# Patient Record
Sex: Female | Born: 1990
Health system: Southern US, Community
[De-identification: ages and names within clinical notes are randomized; demographics above are authoritative.]

## PROBLEM LIST (undated history)

## (undated) DIAGNOSIS — M51369 Other intervertebral disc degeneration, lumbar region without mention of lumbar back pain or lower extremity pain: Secondary | ICD-10-CM

## (undated) DIAGNOSIS — M5136 Other intervertebral disc degeneration, lumbar region: Secondary | ICD-10-CM

## (undated) DIAGNOSIS — N390 Urinary tract infection, site not specified: Secondary | ICD-10-CM

## (undated) DIAGNOSIS — K219 Gastro-esophageal reflux disease without esophagitis: Secondary | ICD-10-CM

## (undated) DIAGNOSIS — G43909 Migraine, unspecified, not intractable, without status migrainosus: Secondary | ICD-10-CM

## (undated) HISTORY — DX: Urinary tract infection, site not specified: N39.0

## (undated) HISTORY — DX: Migraine, unspecified, not intractable, without status migrainosus: G43.909

## (undated) HISTORY — PX: TYMPANOSTOMY TUBE PLACEMENT: SHX32

## (undated) HISTORY — DX: Gastro-esophageal reflux disease without esophagitis: K21.9

## (undated) HISTORY — PX: NO PAST SURGERIES: SHX2092

---
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
## 2012-01-03 ENCOUNTER — Ambulatory Visit (INDEPENDENT_AMBULATORY_CARE_PROVIDER_SITE_OTHER): Payer: BC Managed Care – PPO | Admitting: Family Medicine

## 2012-01-03 ENCOUNTER — Encounter: Payer: Self-pay | Admitting: Family Medicine

## 2012-01-03 ENCOUNTER — Ambulatory Visit: Payer: BC Managed Care – PPO | Admitting: Family Medicine

## 2012-01-03 VITALS — BP 104/62 | HR 110 | Temp 98.2°F | Ht 65.25 in | Wt 120.0 lb

## 2012-01-03 DIAGNOSIS — Z0000 Encounter for general adult medical examination without abnormal findings: Secondary | ICD-10-CM

## 2012-01-03 DIAGNOSIS — Z23 Encounter for immunization: Secondary | ICD-10-CM

## 2012-01-03 NOTE — Progress Notes (Signed)
  Subjective:    Patient ID: Samantha Knapp, female    DOB: [birth year], 21 y.o.   MRN: [id number]  HPI 21 yr old female to establish with [place] and for a cpx. She had seen Dr. Carlean Purl when younger. She feels fine and has no concerns. 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 She tries to eat well and gets exercise. She plays on a softball team. She started menses at the age of 68, and she has been regular with no problems since then. She has never been sexually active.    Review of Systems  Constitutional: Negative.   HENT: Negative.   Eyes: Negative.   Respiratory: Negative.   Cardiovascular: Negative.   Gastrointestinal: Negative.   Genitourinary: Negative for dysuria, urgency, frequency, hematuria, flank pain, decreased urine volume, enuresis, difficulty urinating, pelvic pain and dyspareunia.  Musculoskeletal: Negative.   Skin: Negative.   Neurological: Negative.   Hematological: Negative.   Psychiatric/Behavioral: Negative.        Objective:   Physical Exam  Constitutional: She is oriented to person, place, and time. She appears well-developed and well-nourished. No distress.  HENT:  Head: Normocephalic and atraumatic.  Right Ear: External ear normal.  Left Ear: External ear normal.  Nose: Nose normal.  Mouth/Throat: Oropharynx is clear and moist. No oropharyngeal exudate.  Eyes: Conjunctivae and EOM are normal. Pupils are equal, round, and reactive to light. No scleral icterus.  Neck: Normal range of motion. Neck supple. No JVD present. No thyromegaly present.  Cardiovascular: Normal rate, regular rhythm, normal heart sounds and intact distal pulses.  Exam reveals no gallop and no friction rub.   No murmur heard. Pulmonary/Chest: Effort normal and breath sounds normal. No respiratory distress. She has no wheezes. She has no rales. She exhibits no tenderness.  Abdominal: Soft. Bowel sounds are normal. She exhibits no  distension and no mass. There is no tenderness. There is no rebound and no guarding.  Musculoskeletal: Normal range of motion. She exhibits no edema and no tenderness.  Lymphadenopathy:    She has no cervical adenopathy.  Neurological: She is alert and oriented to person, place, and time. She has normal reflexes. No cranial nerve deficit. She exhibits normal muscle tone. Coordination normal.  Skin: Skin is warm and dry. No rash noted. No erythema.  Psychiatric: She has a normal mood and affect. Her behavior is normal. Judgment and thought content normal.          Assessment & Plan:  Well exam. Given a flu shot and her first Gardisil shot today. Get old records.

## 2012-01-03 NOTE — Progress Notes (Signed)
Addended by: Aniceto Boss A on: 01/03/2012 11:20 AM   Modules accepted: Orders

## 2012-03-02 ENCOUNTER — Ambulatory Visit: Payer: BC Managed Care – PPO | Admitting: Family Medicine

## 2012-03-05 ENCOUNTER — Ambulatory Visit (INDEPENDENT_AMBULATORY_CARE_PROVIDER_SITE_OTHER): Payer: BC Managed Care – PPO | Admitting: Family Medicine

## 2012-03-05 DIAGNOSIS — Z23 Encounter for immunization: Secondary | ICD-10-CM

## 2012-03-14 ENCOUNTER — Encounter: Payer: Self-pay | Admitting: Family Medicine

## 2012-03-14 ENCOUNTER — Ambulatory Visit (INDEPENDENT_AMBULATORY_CARE_PROVIDER_SITE_OTHER): Payer: BC Managed Care – PPO | Admitting: Family Medicine

## 2012-03-14 VITALS — BP 98/60 | HR 79 | Temp 98.5°F | Wt 124.0 lb

## 2012-03-14 DIAGNOSIS — L708 Other acne: Secondary | ICD-10-CM

## 2012-03-14 DIAGNOSIS — L709 Acne, unspecified: Secondary | ICD-10-CM

## 2012-03-14 DIAGNOSIS — R5383 Other fatigue: Secondary | ICD-10-CM

## 2012-03-14 DIAGNOSIS — R0602 Shortness of breath: Secondary | ICD-10-CM

## 2012-03-14 LAB — CBC WITH DIFFERENTIAL/PLATELET
Basophils Absolute: 0 10*3/uL (ref 0.0–0.1)
Eosinophils Absolute: 0.2 10*3/uL (ref 0.0–0.7)
Lymphocytes Relative: 33.2 % (ref 12.0–46.0)
Lymphs Abs: 2.1 10*3/uL (ref 0.7–4.0)
Monocytes Relative: 9.8 % (ref 3.0–12.0)
Platelets: 247 10*3/uL (ref 150.0–400.0)
RDW: 13.9 % (ref 11.5–14.6)

## 2012-03-14 LAB — BASIC METABOLIC PANEL
BUN: 8 mg/dL (ref 6–23)
Creatinine, Ser: 0.7 mg/dL (ref 0.4–1.2)
GFR: 109.41 mL/min (ref >60.00)

## 2012-03-14 LAB — TSH: TSH: 1.42 u[IU]/mL (ref 0.35–5.50)

## 2012-03-14 NOTE — Progress Notes (Signed)
  Subjective:    Patient ID: Samantha Knapp, female    DOB: 1991-02-20, 21 y.o.   MRN: [id number]  HPI Here asking about possible exercise induced asthma. She has always been active and involved with sports, and has never had much trouble with exercise until the last month or two. She is currently playing softball in a church league, and she often feels very SOB when she runs hard. No chest pains or palpitations, she just has a hard time catching her breath for a few minutes after a hard run. She recovers quickly, and she has never had to come out of a game for this. She denies any wheezing or coughing. She has never had asthma before, but she has been given an inhaler as part of the treatment for a bronchitis she had last year. This is a Advertising account planner inhaler, which she only used once or twice. No family hx of asthma. Also she asks for a referral for her acne, which invloves her face and upper back.    Review of Systems  Constitutional: Negative.   Respiratory: Positive for shortness of breath. Negative for apnea, cough, choking, chest tightness, wheezing and stridor.   Cardiovascular: Negative.        Objective:   Physical Exam  Constitutional: She appears well-developed and well-nourished.  Neck: Neck supple. No thyromegaly present.  Cardiovascular: Normal rate, regular rhythm, normal heart sounds and intact distal pulses.  Exam reveals no gallop and no friction rub.   No murmur heard. Pulmonary/Chest: Effort normal and breath sounds normal. No respiratory distress. She has no wheezes. She has no rales. She exhibits no tenderness.  Lymphadenopathy:    She has no cervical adenopathy.          Assessment & Plan:  This does not sound like true exercise induced asthma, but since she has a Proair inhaler at home, I asked her to try this. She will take 2 puffs just before any games or practices to see if this helps. We will defer a detailed workup for now. Certainly we can get PFTs etc if  needed. I suspect something else may be involved, such as anemia, since she tends to have heavy menses. We will check labs today including a TSH and a CBC. Refer to Dermatology for the acne.

## 2012-03-20 NOTE — Progress Notes (Signed)
Quick Note:  Pt informed on VM ______ 

## 2012-07-02 ENCOUNTER — Ambulatory Visit (INDEPENDENT_AMBULATORY_CARE_PROVIDER_SITE_OTHER): Payer: BC Managed Care – PPO | Admitting: Family Medicine

## 2012-07-02 DIAGNOSIS — Z23 Encounter for immunization: Secondary | ICD-10-CM

## 2012-07-02 DIAGNOSIS — Z0000 Encounter for general adult medical examination without abnormal findings: Secondary | ICD-10-CM

## 2012-08-14 ENCOUNTER — Encounter: Payer: Self-pay | Admitting: Internal Medicine

## 2012-08-14 ENCOUNTER — Ambulatory Visit (INDEPENDENT_AMBULATORY_CARE_PROVIDER_SITE_OTHER): Payer: BC Managed Care – PPO | Admitting: Internal Medicine

## 2012-08-14 VITALS — BP 122/62 | Temp 98.8°F | Wt 129.0 lb

## 2012-08-14 DIAGNOSIS — M545 Low back pain, unspecified: Secondary | ICD-10-CM

## 2012-08-14 DIAGNOSIS — G8929 Other chronic pain: Secondary | ICD-10-CM

## 2012-08-14 NOTE — Progress Notes (Signed)
  Subjective:    Patient ID: Samantha Knapp, female    DOB: [birth year], 21 y.o.   MRN: [id number]  HPI  21 year old Female complains of chronic intermittent low back pain. Patient reports her symptoms have been on and off for last one year. She describes tooth ache like pain in her right lower back. Severity is rated as 6/10. She sometimes wakes up with pain and also weightbearing on her right leg seems to make the pain worse. Symptoms are improved with laying down. She denies any radiation into her right leg. She denies any fever or chills. She denies any urinary symptoms.   Review of Systems Negative for lower extremity weakness.  She denies morning stiffness. No history of the joint pains or unusual rashes.  Past Medical History  Diagnosis Date  . Migraines   . Frequent UTI     History   Social History  . Marital Status: Single    Spouse Name: N/A    Number of Children: N/A  . Years of Education: N/A   Occupational History  . Not on file.   Social History Main Topics  . Smoking status: Passive Smoke Exposure - Never Smoker  . Smokeless tobacco: Never Used  . Alcohol Use: No  . Drug Use: No  . Sexually Active: Yes    Birth Control/ Protection: Condom   Other Topics Concern  . Not on file   Social History Narrative  . No narrative on file    Past Surgical History  Procedure Date  . Tympanostomy tube placement     Family History  Problem Relation Age of Onset  . Hyperlipidemia Mother     No Known Allergies  Current Outpatient Prescriptions on File Prior to Visit  Medication Sig Dispense Refill  . desogestrel-ethinyl estradiol (APRI,EMOQUETTE,SOLIA) 0.15-30 MG-MCG tablet Take 1 tablet by mouth daily.        BP 122/62  Temp 98.8 F (37.1 C) (Oral)  Wt 129 lb (58.514 kg)       Objective:   Physical Exam  Constitutional: She is oriented to person, place, and time. She appears well-developed and well-nourished.  Cardiovascular: Normal rate, regular  rhythm and normal heart sounds.   Pulmonary/Chest: Effort normal and breath sounds normal. She has no wheezes.  Musculoskeletal:       Right sacroiliac tenderness No obvious scoliosis Muscle strength is 5 out of 5 in her lower extremities Positive straight leg raise test on right side  Neurological: She is alert and oriented to person, place, and time. She displays normal reflexes. No cranial nerve deficit. She exhibits normal muscle tone.  Skin: Skin is warm and dry. No rash noted.          Assessment & Plan:

## 2012-08-14 NOTE — Patient Instructions (Signed)
Please make a follow up appointment within 2 weeks of completing your MRI of lumbar spine.

## 2012-08-14 NOTE — Assessment & Plan Note (Signed)
21 year old female with chronic low back pain of unclear etiology. Considering chronicity of her symptoms (1 year) and positive straight leg test obtain MRI of lumbar spine without contrast.  She has sacroiliac tenderness. It would be interesting to see if she has sacroiliitis on MRI.  Rule out radiculopathy.  Use OTC ibuprofen as directed for now.

## 2012-08-20 ENCOUNTER — Ambulatory Visit
Admission: RE | Admit: 2012-08-20 | Discharge: 2012-08-20 | Disposition: A | Discharge status: Home / Self Care | Payer: BC Managed Care – PPO | Source: Ambulatory Visit | Attending: Internal Medicine | Admitting: Internal Medicine

## 2012-08-20 DIAGNOSIS — M545 Low back pain: Secondary | ICD-10-CM

## 2012-09-03 ENCOUNTER — Ambulatory Visit (INDEPENDENT_AMBULATORY_CARE_PROVIDER_SITE_OTHER): Payer: BC Managed Care – PPO | Admitting: Internal Medicine

## 2012-09-03 ENCOUNTER — Encounter: Payer: Self-pay | Admitting: Internal Medicine

## 2012-09-03 VITALS — BP 108/72 | Temp 98.9°F | Wt 126.0 lb

## 2012-09-03 DIAGNOSIS — G8929 Other chronic pain: Secondary | ICD-10-CM

## 2012-09-03 DIAGNOSIS — M545 Low back pain: Secondary | ICD-10-CM

## 2012-09-03 MED ORDER — METHOCARBAMOL 500 MG PO TABS: 250.0000 mg | ORAL_TABLET | Freq: Three times a day (TID) | ORAL | Status: DC | PRN

## 2012-09-03 NOTE — Progress Notes (Signed)
  Subjective:    Patient ID: Samantha Knapp, female    DOB: 05/25/1991, 21 y.o.   MRN: [id number]  HPI  21 year old Hispanic female for followup regarding low back pain. Patient continues to have intermittent right-sided back discomfort. She is taking over-the-counter ibuprofen as needed. MRI of lumbar spine was ordered due to chronicity of symptoms and positive right straight leg test.  Patient had tenderness at right SI Joint on exam. MRI of lumbar spine showed mild degenerative disease causing mild impingement at the L4-5 and L5-S1 levels. Patient also noted to have mild central stenosis. Her SI Joints were normal in appearance.   Review of Systems Negative for leg weakness   Past Medical History  Diagnosis Date  . Migraines   . Frequent UTI     History   Social History  . Marital Status: Single    Spouse Name: N/A    Number of Children: N/A  . Years of Education: N/A   Occupational History  . Not on file.   Social History Main Topics  . Smoking status: Passive Smoke Exposure - Never Smoker  . Smokeless tobacco: Never Used  . Alcohol Use: No  . Drug Use: No  . Sexually Active: Yes    Birth Control/ Protection: Condom   Other Topics Concern  . Not on file   Social History Narrative  . No narrative on file    Past Surgical History  Procedure Date  . Tympanostomy tube placement     Family History  Problem Relation Age of Onset  . Hyperlipidemia Mother     No Known Allergies  Current Outpatient Prescriptions on File Prior to Visit  Medication Sig Dispense Refill  . desogestrel-ethinyl estradiol (APRI,EMOQUETTE,SOLIA) 0.15-30 MG-MCG tablet Take 1 tablet by mouth daily.      Marland Kitchen ibuprofen (ADVIL) 200 MG tablet Take 2 tablets (400 mg total) by mouth every 12 (twelve) hours as needed for pain.  30 tablet  0    BP 108/72  Temp 98.9 F (37.2 C) (Oral)  Wt 126 lb (57.153 kg)       Objective:   Physical Exam  Constitutional: She is oriented to person,  place, and time. She appears well-developed and well-nourished.  Cardiovascular: Normal rate, regular rhythm and normal heart sounds.   Pulmonary/Chest: Effort normal and breath sounds normal. She has no wheezes.  Neurological: She is alert and oriented to person, place, and time. She has normal reflexes.       Lower extremity muscle strength is 5 out of 5          Assessment & Plan:

## 2012-09-03 NOTE — Assessment & Plan Note (Signed)
MRI of lumbar spine shows mild lumbar degenerative disc disease causing mild impingement at L4-5 and L5-S1 levels. She is not a surgical candidate. I suggest conservative therapy. Refer to PT. Use Robaxin 250 mg 3 times a day as needed.

## 2012-09-10 ENCOUNTER — Telehealth: Payer: Self-pay | Admitting: Family Medicine

## 2012-09-10 NOTE — Telephone Encounter (Signed)
Patient called stating that she has been referred to Pt on [address] and she can not afford to drive there and would like to know if she can be referred to [facility] in [place] as they are located 5 minutes from her home. Please advise/assist. Dr. Artist Pais did this referral. Patient is scheduled for tomorrow.

## 2012-09-10 NOTE — Telephone Encounter (Signed)
Samantha Knapp, will you change appt to [facility] at [facility]?  Thanks

## 2012-10-10 ENCOUNTER — Ambulatory Visit (INDEPENDENT_AMBULATORY_CARE_PROVIDER_SITE_OTHER): Payer: BC Managed Care – PPO | Admitting: Family Medicine

## 2012-10-10 ENCOUNTER — Encounter: Payer: Self-pay | Admitting: Family Medicine

## 2012-10-10 VITALS — BP 102/60 | HR 98 | Temp 98.7°F | Wt 125.0 lb

## 2012-10-10 DIAGNOSIS — J329 Chronic sinusitis, unspecified: Secondary | ICD-10-CM

## 2012-10-10 MED ORDER — AZITHROMYCIN 250 MG PO TABS: ORAL_TABLET | ORAL | Status: DC

## 2012-10-10 NOTE — Progress Notes (Signed)
  Subjective:    Patient ID: Samantha Knapp, female    DOB: 1991-06-08, 21 y.o.   MRN: [id number]  HPI Here for one week of sinus pressure, PND, ST, HA, and a dry cough. No fever.    Review of Systems  Constitutional: Negative.   HENT: Positive for congestion, postnasal drip and sinus pressure.   Eyes: Negative.   Respiratory: Positive for cough.        Objective:   Physical Exam  Constitutional: She appears well-developed and well-nourished.  HENT:  Right Ear: External ear normal.  Left Ear: External ear normal.  Nose: Nose normal.  Mouth/Throat: Oropharynx is clear and moist.  Eyes: Conjunctivae normal are normal.  Pulmonary/Chest: Effort normal and breath sounds normal.  Lymphadenopathy:    She has no cervical adenopathy.          Assessment & Plan:  Add Mucinex D

## 2013-06-25 ENCOUNTER — Encounter: Payer: Self-pay | Admitting: Family Medicine

## 2013-06-25 ENCOUNTER — Ambulatory Visit (INDEPENDENT_AMBULATORY_CARE_PROVIDER_SITE_OTHER): Payer: BC Managed Care – PPO | Admitting: Family Medicine

## 2013-06-25 VITALS — BP 100/58 | HR 91 | Temp 98.8°F | Wt 128.0 lb

## 2013-06-25 DIAGNOSIS — J019 Acute sinusitis, unspecified: Secondary | ICD-10-CM

## 2013-06-25 MED ORDER — AZITHROMYCIN 250 MG PO TABS: ORAL_TABLET | ORAL | Status: DC

## 2013-06-25 NOTE — Progress Notes (Signed)
  Subjective:    Patient ID: Samantha Knapp, female    DOB: Jan 29, 1991, 22 y.o.   MRN: [id number]  HPI Here for 4 days of sinus pressure, PND, and coughing up green sputum. No fever.    Review of Systems  Constitutional: Negative.   HENT: Positive for congestion, postnasal drip and sinus pressure.   Eyes: Negative.   Respiratory: Positive for cough.        Objective:   Physical Exam  Constitutional: She appears well-developed and well-nourished.  HENT:  Right Ear: External ear normal.  Left Ear: External ear normal.  Nose: Nose normal.  Mouth/Throat: Oropharynx is clear and moist.  Eyes: Conjunctivae are normal.  Pulmonary/Chest: Effort normal and breath sounds normal.  Lymphadenopathy:    She has no cervical adenopathy.          Assessment & Plan:  Add Mucinex

## 2013-09-13 ENCOUNTER — Encounter: Payer: Self-pay | Admitting: Internal Medicine

## 2013-09-13 ENCOUNTER — Ambulatory Visit (INDEPENDENT_AMBULATORY_CARE_PROVIDER_SITE_OTHER): Payer: BC Managed Care – PPO | Admitting: Internal Medicine

## 2013-09-13 VITALS — BP 100/60 | HR 88 | Temp 100.1°F | Resp 20 | Wt 130.0 lb

## 2013-09-13 DIAGNOSIS — J020 Streptococcal pharyngitis: Secondary | ICD-10-CM

## 2013-09-13 MED ORDER — AMOXICILLIN 500 MG PO CAPS: 500.0000 mg | ORAL_CAPSULE | Freq: Three times a day (TID) | ORAL | Status: DC

## 2013-09-13 NOTE — Progress Notes (Signed)
Subjective:    Patient ID: Samantha Knapp, female    DOB: 11-Jul-1991, 22 y.o.   MRN: [id number]  HPI  22 year old patient who presents with a two-day history of headache sore throat fever tender cervical adenopathy and a general sense of wellness. She has had strep infections in the past with an identical presentation. She denies any cough hoarseness or rhinorrhea. She also states that in the past she has had early false-negative rapid strep test that later become positive. No obvious strep exposure.  Past Medical History  Diagnosis Date  . Migraines   . Frequent UTI     History   Social History  . Marital Status: Single    Spouse Name: N/A    Number of Children: N/A  . Years of Education: N/A   Occupational History  . Not on file.   Social History Main Topics  . Smoking status: Passive Smoke Exposure - Never Smoker  . Smokeless tobacco: Never Used  . Alcohol Use: No  . Drug Use: No  . Sexual Activity: Yes    Birth Control/ Protection: Condom   Other Topics Concern  . Not on file   Social History Narrative  . No narrative on file    Past Surgical History  Procedure Laterality Date  . Tympanostomy tube placement      Family History  Problem Relation Age of Onset  . Hyperlipidemia Mother     No Known Allergies  Current Outpatient Prescriptions on File Prior to Visit  Medication Sig Dispense Refill  . Etonogestrel (IMPLANON [place]) Inject into the skin.      Marland Kitchen ibuprofen (ADVIL) 200 MG tablet Take 2 tablets (400 mg total) by mouth every 12 (twelve) hours as needed for pain.  30 tablet  0   No current facility-administered medications on file prior to visit.    BP 100/60  Pulse 88  Temp(Src) 100.1 F (37.8 C) (Oral)  Resp 20  Wt 130 lb (58.968 kg)  BMI 21.48 kg/m2  SpO2 98%  LMP 09/07/2013       Review of Systems  Constitutional: Positive for fever, activity change, appetite change and fatigue.  HENT: Positive for sore throat. Negative for  congestion, dental problem, hearing loss, rhinorrhea, sinus pressure and tinnitus.   Eyes: Negative for pain, discharge and visual disturbance.  Respiratory: Negative for cough and shortness of breath.   Cardiovascular: Negative for chest pain, palpitations and leg swelling.  Gastrointestinal: Negative for nausea, vomiting, abdominal pain, diarrhea, constipation, blood in stool and abdominal distention.  Genitourinary: Negative for dysuria, urgency, frequency, hematuria, flank pain, vaginal bleeding, vaginal discharge, difficulty urinating, vaginal pain and pelvic pain.  Musculoskeletal: Negative for arthralgias, gait problem and joint swelling.  Skin: Negative for rash.  Neurological: Positive for weakness and headaches. Negative for dizziness, syncope, speech difficulty and numbness.  Hematological: Negative for adenopathy.  Psychiatric/Behavioral: Negative for behavioral problems, dysphoric mood and agitation. The patient is not nervous/anxious.        Objective:   Physical Exam  Constitutional: She is oriented to person, place, and time. She appears well-developed and well-nourished.  Temperature 100.1 Malaise Accompanied by grandmother  HENT:  Head: Normocephalic.  Right Ear: External ear normal.  Left Ear: External ear normal.  Pharyngitis with tonsillar enlargement No exudate  Eyes: Conjunctivae and EOM are normal. Pupils are equal, round, and reactive to light.  Neck: Normal range of motion. Neck supple. No thyromegaly present.  Left greater than right tender cervical adenopathy  Cardiovascular: Normal  rate, regular rhythm, normal heart sounds and intact distal pulses.   Pulmonary/Chest: Effort normal and breath sounds normal.  Abdominal: Soft. Bowel sounds are normal. She exhibits no mass. There is no tenderness.  No organomegaly  Musculoskeletal: Normal range of motion.  Lymphadenopathy:    She has cervical adenopathy.  Neurological: She is alert and oriented to person,  place, and time.  Skin: Skin is warm and dry. No rash noted.  Psychiatric: She has a normal mood and affect. Her behavior is normal.          Assessment & Plan:   Probable strep pharyngitis. We'll treat with amoxicillin Tylenol and Advil

## 2013-09-13 NOTE — Patient Instructions (Signed)
Strep Throat  Strep throat is an infection of the throat caused by a bacteria named Streptococcus pyogenes. Your caregiver may call the infection streptococcal "tonsillitis" or "pharyngitis" depending on whether there are signs of inflammation in the tonsils or back of the throat. Strep throat is most common in children aged 20 15 years during the cold months of the year, but it can occur in people of any age during any season. This infection is spread from person to person (contagious) through coughing, sneezing, or other close contact.  SYMPTOMS   · Fever or chills.  · Painful, swollen, red tonsils or throat.  · Pain or difficulty when swallowing.  · White or yellow spots on the tonsils or throat.  · Swollen, tender lymph nodes or "glands" of the neck or under the jaw.  · Red rash all over the body (rare).  DIAGNOSIS   Many different infections can cause the same symptoms. A test must be done to confirm the diagnosis so the right treatment can be given. A "rapid strep test" can help your caregiver make the diagnosis in a few minutes. If this test is not available, a light swab of the infected area can be used for a throat culture test. If a throat culture test is done, results are usually available in a day or two.  TREATMENT   Strep throat is treated with antibiotic medicine.  HOME CARE INSTRUCTIONS   · Gargle with 1 tsp of salt in 1 cup of warm water, 3 4 times per day or as needed for comfort.  · Family members who also have a sore throat or fever should be tested for strep throat and treated with antibiotics if they have the strep infection.  · Make sure everyone in your household washes their hands well.  · Do not share food, drinking cups, or personal items that could cause the infection to spread to others.  · You may need to eat a soft food diet until your sore throat gets better.  · Drink enough water and fluids to keep your urine clear or pale yellow. This will help prevent dehydration.  · Get plenty of  rest.  · Stay home from school, daycare, or work until you have been on antibiotics for 24 hours.  · Only take over-the-counter or prescription medicines for pain, discomfort, or fever as directed by your caregiver.  · If antibiotics are prescribed, take them as directed. Finish them even if you start to feel better.  SEEK MEDICAL CARE IF:   · The glands in your neck continue to enlarge.  · You develop a rash, cough, or earache.  · You cough up green, yellow-brown, or bloody sputum.  · You have pain or discomfort not controlled by medicines.  · Your problems seem to be getting worse rather than better.  SEEK IMMEDIATE MEDICAL CARE IF:   · You develop any new symptoms such as vomiting, severe headache, stiff or painful neck, chest pain, shortness of breath, or trouble swallowing.  · You develop severe throat pain, drooling, or changes in your voice.  · You develop swelling of the neck, or the skin on the neck becomes red and tender.  · You have a fever.  · You develop signs of dehydration, such as fatigue, dry mouth, and decreased urination.  · You become increasingly sleepy, or you cannot wake up completely.  Document Released: 11/11/2000 Document Revised: 10/31/2012 Document Reviewed: 01/13/2011  [employer]® Patient Information ©2014 [employer].

## 2014-01-10 ENCOUNTER — Encounter: Payer: Self-pay | Admitting: Family Medicine

## 2014-01-10 ENCOUNTER — Ambulatory Visit (INDEPENDENT_AMBULATORY_CARE_PROVIDER_SITE_OTHER): Payer: BC Managed Care – PPO | Admitting: Family Medicine

## 2014-01-10 VITALS — BP 98/54 | HR 113 | Temp 100.0°F | Ht 65.25 in | Wt 134.0 lb

## 2014-01-10 DIAGNOSIS — J029 Acute pharyngitis, unspecified: Secondary | ICD-10-CM

## 2014-01-10 MED ORDER — CEPHALEXIN 500 MG PO CAPS: 500.0000 mg | ORAL_CAPSULE | Freq: Three times a day (TID) | ORAL | Status: AC

## 2014-01-10 MED ORDER — PROMETHAZINE HCL 25 MG PO TABS: 25.0000 mg | ORAL_TABLET | ORAL | Status: DC | PRN

## 2014-01-10 NOTE — Progress Notes (Signed)
Pre visit review using our clinic review tool, if applicable. No additional management support is needed unless otherwise documented below in the visit note. 

## 2014-01-10 NOTE — Progress Notes (Signed)
   Subjective:    Patient ID: Samantha Knapp, female    DOB: 01/05/1991, 23 y.o.   MRN: [id number]  HPI Here for 2 days of fever, body aches, a bad ST, HA, and nausea and vomiting. No coughing. She went to [facility] yesterday and a rapid strep test was negative, but they did send off a throat culture. They told her to drink fluids and use Advil prn. Today she is no better.    Review of Systems  Constitutional: Positive for fever.  HENT: Positive for sore throat and trouble swallowing. Negative for ear pain, postnasal drip, rhinorrhea and sinus pressure.   Eyes: Negative.   Respiratory: Negative.   Gastrointestinal: Positive for nausea and vomiting. Negative for abdominal pain, diarrhea, constipation, blood in stool and abdominal distention.       Objective:   Physical Exam  Constitutional: She appears well-developed and well-nourished.  Ill appearing   HENT:  Right Ear: External ear normal.  Left Ear: External ear normal.  Nose: Nose normal.  Posterior OP is red with a white exudate   Eyes: Conjunctivae are normal.  Neck: No thyromegaly present.  Shotty AC nodes   Pulmonary/Chest: Effort normal and breath sounds normal.  Abdominal: Soft. Bowel sounds are normal. She exhibits no distension and no mass. There is no tenderness. There is no rebound and no guarding.          Assessment & Plan:  Probable strep throat. Start on Keflex while the culture is pending.

## 2014-01-13 ENCOUNTER — Ambulatory Visit: Payer: BC Managed Care – PPO | Admitting: Family Medicine

## 2014-02-12 ENCOUNTER — Emergency Department (HOSPITAL_BASED_OUTPATIENT_CLINIC_OR_DEPARTMENT_OTHER)
Admission: EM | Admit: 2014-02-12 | Discharge: 2014-02-12 | Disposition: A | Discharge status: Home / Self Care | Payer: BC Managed Care – PPO | Attending: Emergency Medicine | Admitting: Emergency Medicine

## 2014-02-12 ENCOUNTER — Encounter (HOSPITAL_BASED_OUTPATIENT_CLINIC_OR_DEPARTMENT_OTHER): Payer: Self-pay | Admitting: Emergency Medicine

## 2014-02-12 DIAGNOSIS — J3489 Other specified disorders of nose and nasal sinuses: Secondary | ICD-10-CM | POA: Insufficient documentation

## 2014-02-12 DIAGNOSIS — Z8679 Personal history of other diseases of the circulatory system: Secondary | ICD-10-CM | POA: Insufficient documentation

## 2014-02-12 DIAGNOSIS — L03213 Periorbital cellulitis: Secondary | ICD-10-CM

## 2014-02-12 DIAGNOSIS — Z87440 Personal history of urinary (tract) infections: Secondary | ICD-10-CM | POA: Insufficient documentation

## 2014-02-12 DIAGNOSIS — H05019 Cellulitis of unspecified orbit: Secondary | ICD-10-CM | POA: Insufficient documentation

## 2014-02-12 DIAGNOSIS — H109 Unspecified conjunctivitis: Secondary | ICD-10-CM | POA: Insufficient documentation

## 2014-02-12 MED ORDER — AMOXICILLIN-POT CLAVULANATE 500-125 MG PO TABS: 1.0000 | ORAL_TABLET | Freq: Two times a day (BID) | ORAL | Status: DC

## 2014-02-12 MED ORDER — TOBRAMYCIN-DEXAMETHASONE 0.3-0.1 % OP OINT: 1.0000 "application " | TOPICAL_OINTMENT | Freq: Three times a day (TID) | OPHTHALMIC | Status: DC

## 2014-02-12 NOTE — ED Provider Notes (Signed)
CSN: [id number]     Arrival date & time 02/12/14  1800 History  This chart was scribed for Rolland PorterMark Rockie Schnoor, MD by Ellin MayhewMichael Knapp, ED Scribe. This patient was seen in room MH03/MH03 and the patient's care was started at 6:39 PM.   Chief Complaint  Patient presents with  . Conjunctivitis   The history is provided by the patient. No language interpreter was used.   HPI Comments: Samantha PellegriniDanielle E Royster is a 23 y.o. female who presents to the Emergency Department with a chief complaint of conjunctivitis, bilaterally with onset one week ago. Patient states that one week ago, her R eye began to feel itchy and quickly became swollen. Patient states she began applying generic eye drops 5-6 days ago, to the R eye with moderate relief one day after initial application; however, her R eye remains mildly itchy today. She states that her L eye began feeling itchy as the problems in the R eye subsided (4-5 days ago), and became swollen today. Patient states she has continued to use the eye drops in both eyes up to today. She reports having associated blurry vision and mild pain made worse with contact. Patient denies any known allergies or any other medical condition.  Past Medical History  Diagnosis Date  . Migraines   . Frequent UTI    Past Surgical History  Procedure Laterality Date  . Tympanostomy tube placement     Family History  Problem Relation Age of Onset  . Hyperlipidemia Mother    History  Substance Use Topics  . Smoking status: Passive Smoke Exposure - Never Smoker  . Smokeless tobacco: Never Used  . Alcohol Use: No   OB History   Grav Para Term Preterm Abortions TAB SAB Ect Mult Living                 Review of Systems  Constitutional: Negative for fever, chills, diaphoresis, appetite change and fatigue.  HENT: Positive for congestion. Negative for mouth sores, sore throat and trouble swallowing.   Eyes: Positive for redness, itching and visual disturbance. Negative for photophobia and  pain.       Swelling noted to L eye  Respiratory: Negative for cough, chest tightness and wheezing.   Gastrointestinal: Negative for nausea, vomiting, diarrhea and abdominal distention.  Endocrine: Negative for polydipsia, polyphagia and polyuria.  Genitourinary: Negative for dysuria, frequency and hematuria.  Musculoskeletal: Negative for gait problem.  Skin: Negative for color change, pallor and rash.  Neurological: Negative for dizziness, syncope and light-headedness.  Hematological: Does not bruise/bleed easily.  Psychiatric/Behavioral: Negative for behavioral problems and confusion.   Allergies  Review of patient's allergies indicates no known allergies.  Home Medications   Current Outpatient Rx  Name  Route  Sig  Dispense  Refill  . amoxicillin-clavulanate (AUGMENTIN) 500-125 MG per tablet   Oral   Take 1 tablet (500 mg total) by mouth 2 (two) times daily.   14 tablet   0   . Etonogestrel (IMPLANON [place])   Subcutaneous   Inject into the skin.         Marland Kitchen. ibuprofen (ADVIL) 200 MG tablet   Oral   Take 2 tablets (400 mg total) by mouth every 12 (twelve) hours as needed for pain.   30 tablet   0   . promethazine (PHENERGAN) 25 MG tablet   Oral   Take 1 tablet (25 mg total) by mouth every 4 (four) hours as needed for nausea or vomiting.   60 tablet  2   . tobramycin-dexamethasone (TOBRADEX) ophthalmic ointment   Both Eyes   Place 1 application into both eyes 3 (three) times daily. 1 qtt ou bid   3.5 g   0    Triage Vitals: BP 125/67  Pulse 74  Temp(Src) 98.2 F (36.8 C) (Oral)  Resp 18  Ht 5\' 5"  (1.651 m)  Wt 135 lb (61.236 kg)  BMI 22.47 kg/m2  SpO2 100%  Physical Exam  Constitutional: She is oriented to person, place, and time. She appears well-developed and well-nourished. No distress.  HENT:  Head: Normocephalic.  Eyes: EOM are normal. No foreign body present in the right eye. No foreign body present in the left eye. Right conjunctiva is injected  (Diffuse bulbar conjunctival injection). Left conjunctiva is not injected.  Direct photophobia, no consensual photophobia. L eye with lower lid edema. R eye normal. No proptosis. No pain with eye movement  Neck: Normal range of motion. Neck supple. No thyromegaly present.  Cardiovascular: Normal rate and regular rhythm.  Exam reveals no gallop and no friction rub.   No murmur heard. Pulmonary/Chest: Effort normal and breath sounds normal. No respiratory distress. She has no wheezes. She has no rales.  Abdominal: Soft. Bowel sounds are normal. She exhibits no distension. There is no tenderness. There is no rebound.  Musculoskeletal: Normal range of motion.  Lymphadenopathy:    She has no cervical adenopathy.    She has no axillary adenopathy.  Neurological: She is alert and oriented to person, place, and time.  Skin: Skin is warm and dry. No rash noted.  Psychiatric: She has a normal mood and affect. Her behavior is normal.    ED Course  Procedures (including critical care time)  DIAGNOSTIC STUDIES: Oxygen Saturation is 100% on room air, normal by my interpretation.    COORDINATION OF CARE: 6:42 PM-Discussed my suspicion of a bacterial infection and my low suspicion of this being a reaction to the eye drops. Will prescribe different eye drops and oral antibiotic. Discussed the possibility of waking up with discharge following initial treatment which will subside with time.Treatment plan discussed with patient and patient agrees.  Labs Review Labs Reviewed - No data to display Imaging Review No results found.   EKG Interpretation None      MDM   Final diagnoses:  Conjunctivitis  Periorbital cellulitis    No clinical signs of orbital cellulitis.  Pt DC'd with Rx, instructions.  I personally performed the services described in this documentation, which was scribed in my presence. The recorded information has been reviewed and is accurate.    [name], MD 02/17/14 [phone]

## 2014-02-12 NOTE — Discharge Instructions (Signed)
Conjunctivitis Conjunctivitis is commonly called "pink eye." Conjunctivitis can be caused by bacterial or viral infection, allergies, or injuries. There is usually redness of the lining of the eye, itching, discomfort, and sometimes discharge. There may be deposits of matter along the eyelids. A viral infection usually causes a watery discharge, while a bacterial infection causes a yellowish, thick discharge. Pink eye is very contagious and spreads by direct contact. You may be given antibiotic eyedrops as part of your treatment. Before using your eye medicine, remove all drainage from the eye by washing gently with warm water and cotton balls. Continue to use the medication until you have awakened 2 mornings in a row without discharge from the eye. Do not rub your eye. This increases the irritation and helps spread infection. Use separate towels from other household members. Wash your hands with soap and water before and after touching your eyes. Use cold compresses to reduce pain and sunglasses to relieve irritation from light. Do not wear contact lenses or wear eye makeup until the infection is gone. SEEK MEDICAL CARE IF:   Your symptoms are not better after 3 days of treatment.  You have increased pain or trouble seeing.  The outer eyelids become very red or swollen. Document Released: 12/22/2004 Document Revised: 02/06/2012 Document Reviewed: 11/14/2005 [facility] Patient Information 2014 [place].  Periorbital Cellulitis Periorbital cellulitis is a common infection that can affect the eyelid and the soft tissues that surround the eyeball. The infection may also affect the structures that produce and drain tears. It does not affect the eyeball itself. Natural tissue barriers usually prevent the spread of this infection to the eyeball and other deeper areas of the eye socket.  CAUSES  Bacterial infection.  Long-term (chronic) sinus infections.  An object (foreign body) stuck behind  the eye.  An injury that goes through the eyelid tissues.  An injury that causes an infection, such as an insect sting.  Fracture of the bone around the eye.  Infections which have spread from the eyelid or other structures around the eye.  Bite wounds.  Inflammation or infection of the lining membranes of the brain (meningitis).  An infection in the blood (septicemia).  Dental infection (abscess).  Viral infection (this is rare). SYMPTOMS Symptoms usually come on suddenly.  Pain in the eye.  Red, hot, and swollen eyelids and possibly cheeks. The swelling is sometimes bad enough that the eyelids cannot open. Some infections make the eyelids look purple.  Fever and feeling generally ill.  Pain when touching the area around the eye. DIAGNOSIS  Periorbital cellulitis can be diagnosed from an eye exam. In severe cases, your caregiver might suggest:  Blood tests.  Imaging tests (such as a CT scan) to examine the sinuses and the area around and behind the eyeball. TREATMENT If your caregiver feels that you do not have any signs of serious infection, treatment may include:  Antibiotics.  Nasal decongestants to reduce swelling.  Referral to a dentist if it is suspected that the infection was caused by a prior tooth infection.  Examination every day to make sure the problem is improving. HOME CARE INSTRUCTIONS  Take your antibiotics as directed. Finish them even if you start to feel better.  Some pain is normal with this condition. Take pain medicine as directed by your caregiver. Only take pain medicines approved by your caregiver.  It is important to drink fluids. Drink enough water and fluids to keep your urine clear or pale yellow.  Do not  smoke.  Rest and get plenty of sleep.  Mild or moderate fevers generally have no long-term effects and often do not require treatment.  If your caregiver has given you a follow-up appointment, it is very important to keep that  appointment. Your caregiver will need to make sure that the infection is getting better. It is important to check that a more serious infection is not developing. SEEK IMMEDIATE MEDICAL CARE IF:  Your eyelids become more painful, red, warm, or swollen.  You develop double vision or your vision becomes blurred or worsens in any way.  You have trouble moving your eyes.  The eye looks like it is popping out (proptosis).  You develop a severe headache, severe neck pain, or neck stiffness.  You develop repeated vomiting.  You have a fever or persistent symptoms for more than 72 hours.  You have a fever and your symptoms suddenly get worse. MAKE SURE YOU:  Understand these instructions.  Will watch your condition.  Will get help right away if you are not doing well or get worse. Document Released: 12/17/2010 Document Revised: 02/06/2012 Document Reviewed: 12/17/2010 [facility] Patient Information 2014 [place], [place].

## 2014-02-12 NOTE — ED Notes (Signed)
Pt reports eye swelling, drainage and redness since this afternoon.  Dx with pink eye on Saturday.

## 2015-01-28 ENCOUNTER — Encounter: Payer: Self-pay | Admitting: Family Medicine

## 2015-01-28 ENCOUNTER — Ambulatory Visit (INDEPENDENT_AMBULATORY_CARE_PROVIDER_SITE_OTHER): Payer: BLUE CROSS/BLUE SHIELD | Admitting: Family Medicine

## 2015-01-28 VITALS — BP 100/60 | HR 70 | Temp 99.1°F | Ht 65.0 in | Wt 140.0 lb

## 2015-01-28 DIAGNOSIS — M778 Other enthesopathies, not elsewhere classified: Secondary | ICD-10-CM

## 2015-01-28 NOTE — Progress Notes (Signed)
   Subjective:    Patient ID: Samantha Knapp, female    DOB: 07/12/1991, 24 y.o.   MRN: [id number]  HPI Here for 3 days of intermittent pain in the left wrist. 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 Then yesterday it flared up again after she took part in batting practice for 90 minutes. She has softball practice once a week. Today it feels a little better. She has not treated it in any way.    Review of Systems  Constitutional: Negative.   Musculoskeletal: Positive for arthralgias. Negative for joint swelling.       Objective:   Physical Exam  Constitutional: She appears well-developed and well-nourished.  Musculoskeletal:  Mildly tender along the ulnar side of the left wrist. She has mild pain on extremes of motion, but her ROM is full. No swelling          Assessment & Plan:  This is overuse tendonitis. Advised her to wear a wrist brace to immobilize the wrist as much as possible. Use ice packs and take Ibuprofen. Recheck prn

## 2015-01-28 NOTE — Progress Notes (Signed)
Pre visit review using our clinic review tool, if applicable. No additional management support is needed unless otherwise documented below in the visit note. 

## 2015-07-16 ENCOUNTER — Encounter (HOSPITAL_BASED_OUTPATIENT_CLINIC_OR_DEPARTMENT_OTHER): Payer: Self-pay

## 2015-07-16 ENCOUNTER — Emergency Department (HOSPITAL_BASED_OUTPATIENT_CLINIC_OR_DEPARTMENT_OTHER)
Admission: EM | Admit: 2015-07-16 | Discharge: 2015-07-16 | Disposition: A | Discharge status: Home / Self Care | Payer: BLUE CROSS/BLUE SHIELD | Attending: Emergency Medicine | Admitting: Emergency Medicine

## 2015-07-16 DIAGNOSIS — Y998 Other external cause status: Secondary | ICD-10-CM | POA: Insufficient documentation

## 2015-07-16 DIAGNOSIS — Z23 Encounter for immunization: Secondary | ICD-10-CM | POA: Insufficient documentation

## 2015-07-16 DIAGNOSIS — S99912A Unspecified injury of left ankle, initial encounter: Secondary | ICD-10-CM | POA: Diagnosis present

## 2015-07-16 DIAGNOSIS — Z8679 Personal history of other diseases of the circulatory system: Secondary | ICD-10-CM | POA: Diagnosis not present

## 2015-07-16 DIAGNOSIS — X58XXXA Exposure to other specified factors, initial encounter: Secondary | ICD-10-CM | POA: Insufficient documentation

## 2015-07-16 DIAGNOSIS — S90512A Abrasion, left ankle, initial encounter: Secondary | ICD-10-CM | POA: Insufficient documentation

## 2015-07-16 DIAGNOSIS — L089 Local infection of the skin and subcutaneous tissue, unspecified: Secondary | ICD-10-CM | POA: Diagnosis not present

## 2015-07-16 DIAGNOSIS — Z87440 Personal history of urinary (tract) infections: Secondary | ICD-10-CM | POA: Insufficient documentation

## 2015-07-16 DIAGNOSIS — Y9364 Activity, baseball: Secondary | ICD-10-CM | POA: Diagnosis not present

## 2015-07-16 DIAGNOSIS — Y92320 Baseball field as the place of occurrence of the external cause: Secondary | ICD-10-CM | POA: Diagnosis not present

## 2015-07-16 MED ORDER — CEPHALEXIN 500 MG PO CAPS: 1000.0000 mg | ORAL_CAPSULE | Freq: Two times a day (BID) | ORAL | Status: DC

## 2015-07-16 MED ORDER — TETANUS-DIPHTH-ACELL PERTUSSIS 5-2.5-18.5 LF-MCG/0.5 IM SUSP
0.5000 mL | Freq: Once | INTRAMUSCULAR | Status: AC
  Administered 2015-07-16: 0.5 mL via INTRAMUSCULAR
  Filled 2015-07-16: qty 0.5

## 2015-07-16 MED ORDER — IBUPROFEN 800 MG PO TABS: 800.0000 mg | ORAL_TABLET | Freq: Three times a day (TID) | ORAL | Status: DC | PRN

## 2015-07-16 NOTE — ED Notes (Signed)
Directed to pharmacy to pick up prescriptions 

## 2015-07-16 NOTE — ED Notes (Signed)
Left ankle injury-slid into base playing softball Tuesday-swelling, redness, abrasion noted

## 2015-07-16 NOTE — Discharge Instructions (Signed)

## 2015-07-16 NOTE — ED Provider Notes (Signed)
CSN: [id number]     Arrival date & time 07/16/15  1637 History   First MD Initiated Contact with Patient 07/16/15 1641     Chief Complaint  Patient presents with  . Ankle Injury     (Consider location/radiation/quality/duration/timing/severity/associated sxs/prior Treatment) HPI   24 year old female presents for evaluation of left ankle injury. Patient mentioned 2 days ago she was playing softball when she slid get onto the third base she suffered an abrasion to the lateral aspects of her left ankle. Denies any significant pain initially and she was able to ambulate afterward. The next day she noticed redness and swelling to the site of the abrasion. She has been cleaning the wound with peroxide and taken Advil. States pain is minimal at this time but she is concerned for infection. She denies any fever, chills, focal numbness or weakness. She denies any other injury. She is unable to recall her last tetanus shot. She has no other complaint.  Past Medical History  Diagnosis Date  . Migraines   . Frequent UTI    Past Surgical History  Procedure Laterality Date  . Tympanostomy tube placement     Family History  Problem Relation Age of Onset  . Hyperlipidemia Mother    Social History  Substance Use Topics  . Smoking status: Never Smoker   . Smokeless tobacco: Never Used  . Alcohol Use: No   OB History    No data available     Review of Systems  Constitutional: Negative for fever.  Skin: Positive for rash and wound.  Neurological: Negative for numbness.      Allergies  Review of patient's allergies indicates no known allergies.  Home Medications   Prior to Admission medications   Not on File   BP 115/47 mmHg  Pulse 88  Temp(Src) 98.6 F (37 C) (Oral)  Resp 18  Ht 5\' 5"  (1.651 m)  Wt 135 lb (61.236 kg)  BMI 22.47 kg/m2  SpO2 100%  LMP 07/15/2015 Physical Exam  Constitutional: She appears well-developed and well-nourished. No distress.  HENT:  Head:  Atraumatic.  Eyes: Conjunctivae are normal.  Neck: Neck supple.  Musculoskeletal: She exhibits tenderness.  L ankle: Ankle with normal dorsiflexion plantar flexion inversion and eversion and no gross deformity. Pedal pulse palpable.   Neurological: She is alert.  Skin: Rash noted.  Left ankle: An abrasion measuring 3 cm in diameter noted to the lateral malleolus region. Surrounding skin erythema involving the lateral aspects of her ankle.   Psychiatric: She has a normal mood and affect.  Nursing note and vitals reviewed.   ED Course  Procedures (including critical care time)  4:54 PM Patient presents with abrasion and redness to her left ankle consistence with a infected abrasion. No evidence of abscess. No significant injury to the ankle and she is able to ambulate without difficulty. She is neurovascularly intact. She will received antibiotic, and pain medication. Patient made aware to return if her condition worsened. Tdap given.    Labs Review Labs Reviewed - No data to display  Imaging Review No results found. I have personally reviewed and evaluated these images and lab results as part of my medical decision-making.   EKG Interpretation None      MDM   Final diagnoses:  Infected abrasion of ankle, left, initial encounter    BP 115/47 mmHg  Pulse 88  Temp(Src) 98.6 F (37 C) (Oral)  Resp 18  Ht 5\' 5"  (1.651 m)  Wt 135 lb (61.236 kg)  BMI 22.47 kg/m2  SpO2 100%  LMP 07/15/2015     Fayrene Helper, PA-C 07/16/15 1659  Elwin Mocha, MD 07/16/15 [phone]

## 2016-08-31 ENCOUNTER — Encounter: Payer: Self-pay | Admitting: Family Medicine

## 2016-08-31 ENCOUNTER — Ambulatory Visit (INDEPENDENT_AMBULATORY_CARE_PROVIDER_SITE_OTHER): Payer: BLUE CROSS/BLUE SHIELD | Admitting: Family Medicine

## 2016-08-31 VITALS — BP 110/69 | HR 77 | Temp 98.3°F | Ht 65.0 in | Wt 133.0 lb

## 2016-08-31 DIAGNOSIS — Z0000 Encounter for general adult medical examination without abnormal findings: Secondary | ICD-10-CM | POA: Diagnosis not present

## 2016-08-31 MED ORDER — LEVONORGESTREL 20 MCG/24HR IU IUD: 1.0000 | INTRAUTERINE_SYSTEM | Freq: Once | INTRAUTERINE | 0 refills | Status: DC

## 2016-08-31 NOTE — Progress Notes (Signed)
Pre visit review using our clinic review tool, if applicable. No additional management support is needed unless otherwise documented below in the visit note. 

## 2016-08-31 NOTE — Addendum Note (Signed)
Addended by: Gershon CraneFRY, Shardae Kleinman A on: 08/31/2016 04:29 PM   Modules accepted: Orders

## 2016-08-31 NOTE — Progress Notes (Signed)
   Subjective:    Patient ID: Samantha Knapp, female    DOB: 10/24/1991, 25 y.o.   MRN: [id number]  HPI 25 yr old female for a well exam. 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 She feels great and has no complaints.    Review of Systems  Constitutional: Negative.   HENT: Negative.   Eyes: Negative.   Respiratory: Negative.   Cardiovascular: Negative.   Gastrointestinal: Negative.   Genitourinary: Negative for decreased urine volume, difficulty urinating, dyspareunia, dysuria, enuresis, flank pain, frequency, hematuria, pelvic pain and urgency.  Musculoskeletal: Negative.   Skin: Negative.   Neurological: Negative.   Psychiatric/Behavioral: Negative.        Objective:   Physical Exam  Constitutional: She is oriented to person, place, and time. She appears well-developed and well-nourished. No distress.  HENT:  Head: Normocephalic and atraumatic.  Right Ear: External ear normal.  Left Ear: External ear normal.  Nose: Nose normal.  Mouth/Throat: Oropharynx is clear and moist. No oropharyngeal exudate.  Eyes: Conjunctivae and EOM are normal. Pupils are equal, round, and reactive to light. No scleral icterus.  Neck: Normal range of motion. Neck supple. No JVD present. No thyromegaly present.  Cardiovascular: Normal rate, regular rhythm, normal heart sounds and intact distal pulses.  Exam reveals no gallop and no friction rub.   No murmur heard. Pulmonary/Chest: Effort normal and breath sounds normal. No respiratory distress. She has no wheezes. She has no rales. She exhibits no tenderness.  Abdominal: Soft. Bowel sounds are normal. She exhibits no distension and no mass. There is no tenderness. There is no rebound and no guarding.  Musculoskeletal: Normal range of motion. She exhibits no edema or tenderness.  Lymphadenopathy:    She has no cervical adenopathy.  Neurological: She is alert and oriented to person, place, and time. She has normal reflexes. No  cranial nerve deficit. She exhibits normal muscle tone. Coordination normal.  Skin: Skin is warm and dry. No rash noted. No erythema.  Psychiatric: She has a normal mood and affect. Her behavior is normal. Judgment and thought content normal.          Assessment & Plan:  Well exam. We discussed diet and exercise. She is cleared for work and she will return next week for a PPD test.  Nelwyn SalisburyFRY,Dolorez Jeffrey A, MD

## 2016-09-05 ENCOUNTER — Ambulatory Visit (INDEPENDENT_AMBULATORY_CARE_PROVIDER_SITE_OTHER): Payer: BLUE CROSS/BLUE SHIELD | Admitting: Family Medicine

## 2016-09-05 ENCOUNTER — Ambulatory Visit: Payer: BLUE CROSS/BLUE SHIELD | Admitting: Family Medicine

## 2016-09-05 DIAGNOSIS — Z23 Encounter for immunization: Secondary | ICD-10-CM | POA: Diagnosis not present

## 2016-09-07 LAB — TB SKIN TEST
INDURATION: 0 mm
TB SKIN TEST: NEGATIVE

## 2016-12-19 DIAGNOSIS — H66001 Acute suppurative otitis media without spontaneous rupture of ear drum, right ear: Secondary | ICD-10-CM | POA: Diagnosis not present

## 2017-01-03 ENCOUNTER — Encounter (HOSPITAL_BASED_OUTPATIENT_CLINIC_OR_DEPARTMENT_OTHER): Payer: Self-pay

## 2017-01-03 ENCOUNTER — Emergency Department (HOSPITAL_BASED_OUTPATIENT_CLINIC_OR_DEPARTMENT_OTHER)
Admission: EM | Admit: 2017-01-03 | Discharge: 2017-01-04 | Disposition: A | Discharge status: Home / Self Care | Payer: BLUE CROSS/BLUE SHIELD | Attending: Emergency Medicine | Admitting: Emergency Medicine

## 2017-01-03 DIAGNOSIS — R0981 Nasal congestion: Secondary | ICD-10-CM | POA: Insufficient documentation

## 2017-01-03 DIAGNOSIS — R69 Illness, unspecified: Secondary | ICD-10-CM

## 2017-01-03 DIAGNOSIS — M791 Myalgia: Secondary | ICD-10-CM | POA: Insufficient documentation

## 2017-01-03 DIAGNOSIS — R509 Fever, unspecified: Secondary | ICD-10-CM | POA: Diagnosis not present

## 2017-01-03 DIAGNOSIS — R0982 Postnasal drip: Secondary | ICD-10-CM | POA: Diagnosis not present

## 2017-01-03 DIAGNOSIS — J111 Influenza due to unidentified influenza virus with other respiratory manifestations: Secondary | ICD-10-CM | POA: Diagnosis not present

## 2017-01-03 DIAGNOSIS — J3489 Other specified disorders of nose and nasal sinuses: Secondary | ICD-10-CM | POA: Diagnosis not present

## 2017-01-03 DIAGNOSIS — R51 Headache: Secondary | ICD-10-CM | POA: Diagnosis not present

## 2017-01-03 LAB — URINALYSIS, ROUTINE W REFLEX MICROSCOPIC
Bilirubin Urine: NEGATIVE
Glucose, UA: NEGATIVE mg/dL
Ketones, ur: NEGATIVE mg/dL
LEUKOCYTES UA: NEGATIVE
NITRITE: NEGATIVE
PH: 7.5 (ref 5.0–8.0)
Protein, ur: NEGATIVE mg/dL
SPECIFIC GRAVITY, URINE: 1.018 (ref 1.005–1.030)

## 2017-01-03 LAB — URINALYSIS, MICROSCOPIC (REFLEX)

## 2017-01-03 LAB — PREGNANCY, URINE: Preg Test, Ur: NEGATIVE

## 2017-01-03 NOTE — ED Triage Notes (Signed)
C/o body aches, fever, nausea x 2 days-NAD-steady gait

## 2017-01-03 NOTE — ED Notes (Signed)
Pt with Nausea and generalized body aches. X 2 days LNBM x 2 days ago

## 2017-01-03 NOTE — ED Provider Notes (Signed)
[facility] Provider Note   CSN: [id number] Arrival date & time: 01/03/17  2145  By signing my name below, I, Modena Jansky, attest that this documentation has been prepared under the direction and in the presence of Glynn Octave, MD. Electronically Signed: Modena Jansky, Scribe. 01/03/2017. 11:21 PM.  History   Chief Complaint Chief Complaint  Patient presents with  . Generalized Body Aches   The history is provided by the patient. No language interpreter was used.   HPI Comments: Samantha Knapp is a 26 y.o. female who presents to the Emergency Department complaining of a headache that started 2 days ago. She states she has been having gradually worsening URI-like symptoms. No treatment PTA. She reports associated symptoms of fever (Tmax: 99), nasal congestion, and generalized myalgias. She had one episode of diarrhea yesterday. Pt's temperature in the ED today was 98.7. She admits to sick contacts. She had this year's influenza vaccination. She denies any chest pain, SOB, cough, fever, dysuria, hematuria, or other complaints.     PCP: Gershon Crane, MD  Past Medical History:  Diagnosis Date  . Frequent UTI   . Migraines     Patient Active Problem List   Diagnosis Date Noted  . Chronic low back pain 08/14/2012    Past Surgical History:  Procedure Laterality Date  . TYMPANOSTOMY TUBE PLACEMENT      OB History    No data available       Home Medications    Prior to Admission medications   Medication Sig Start Date End Date Taking? Authorizing Provider  levonorgestrel (MIRENA, 52 MG,) 20 MCG/24HR IUD 1 Intra Uterine Device (1 each total) by Intrauterine route once. 08/31/16 08/31/16  Nelwyn Salisbury, MD    Family History Family History  Problem Relation Age of Onset  . Hyperlipidemia Mother     Social History Social History  Substance Use Topics  . Smoking status: Never Smoker  . Smokeless tobacco: Never Used  . Alcohol use No     Allergies    Patient has no known allergies.   Review of Systems Review of Systems A complete 10 system review of systems was obtained and all systems are negative except as noted in the HPI and PMH.   Physical Exam Updated Vital Signs BP 130/62 (BP Location: Left Arm)   Pulse 100   Temp 98.7 F (37.1 C) (Oral)   Resp 18   Ht 5\' 5"  (1.651 m)   Wt 134 lb (60.8 kg)   LMP 12/31/2016   SpO2 100%   BMI 22.30 kg/m   Physical Exam  Constitutional: She is oriented to person, place, and time. She appears well-developed and well-nourished. No distress.  HENT:  Head: Normocephalic and atraumatic.  Nose: Rhinorrhea present.  Mouth/Throat: Oropharynx is clear and moist. No oropharyngeal exudate.  Post-nasal drip.  Eyes: Conjunctivae and EOM are normal. Pupils are equal, round, and reactive to light.  Neck: Normal range of motion. Neck supple.  No meningismus.  Cardiovascular: Normal rate, regular rhythm, normal heart sounds and intact distal pulses.   No murmur heard. Pulmonary/Chest: Effort normal and breath sounds normal. No respiratory distress.  Abdominal: Soft. There is no tenderness. There is no rebound and no guarding.  Musculoskeletal: Normal range of motion. She exhibits no edema or tenderness.  Neurological: She is alert and oriented to person, place, and time. No cranial nerve deficit. She exhibits normal muscle tone. Coordination normal.   5/5 strength throughout. CN 2-12 intact.Equal grip strength.  Skin: Skin is warm.  Psychiatric: She has a normal mood and affect. Her behavior is normal.  Nursing note and vitals reviewed.    ED Treatments / Results  DIAGNOSTIC STUDIES: Oxygen Saturation is 100% on RA, normal by my interpretation.    COORDINATION OF CARE: 11:25 PM- Pt advised of plan for treatment and pt agrees.  Labs (all labs ordered are listed, but only abnormal results are displayed) Labs Reviewed  URINALYSIS, ROUTINE W REFLEX MICROSCOPIC - Abnormal; Notable for the  following:       Result Value   Hgb urine dipstick TRACE (*)    All other components within normal limits  URINALYSIS, MICROSCOPIC (REFLEX) - Abnormal; Notable for the following:    Bacteria, UA RARE (*)    Squamous Epithelial / LPF 6-30 (*)    All other components within normal limits  PREGNANCY, URINE    EKG  EKG Interpretation None       Radiology No results found.  Procedures Procedures (including critical care time)  Medications Ordered in ED Medications - No data to display   Initial Impression / Assessment and Plan / ED Course  I have reviewed the triage vital signs and the nursing notes.  Pertinent labs & imaging results that were available during my care of the patient were reviewed by me and considered in my medical decision making (see chart for details).     3 days of body ache, headache, subjective fevers, nausea with 1 episode of vomiting. Denies significant cough, congestion or documented fever.  UA negative. Influenza like illness.  Will give supportive care with PO hydration and antipyretics. Patient agreeable to tamiflu after risks and benefits discussed.  Follow up with PCP. Return precautions discussed.  Finil Clinical Impressions(s) / ED Diagnoses   Final diagnoses:  Influenza-like illness    New Prescriptions New Prescriptions   No medications on file   I personally performed the services described in this documentation, which was scribed in my presence. The recorded information has been reviewed and is accurate.     Glynn OctaveStephen Zale Marcotte, MD 01/04/17 [phone]

## 2017-01-04 MED ORDER — OSELTAMIVIR PHOSPHATE 75 MG PO CAPS: 75.0000 mg | ORAL_CAPSULE | Freq: Two times a day (BID) | ORAL | 0 refills | Status: DC

## 2017-01-04 MED ORDER — IBUPROFEN 400 MG PO TABS
400.0000 mg | ORAL_TABLET | Freq: Once | ORAL | Status: AC
  Administered 2017-01-04: 400 mg via ORAL
  Filled 2017-01-04: qty 1

## 2017-01-04 NOTE — Discharge Instructions (Signed)
Keep yourself hydrated. Use tylenol and motrin as needed as for aches and fever. Follow up with your doctor. Return to the ED if you develop new or worsening symptoms.

## 2017-02-05 ENCOUNTER — Emergency Department (HOSPITAL_BASED_OUTPATIENT_CLINIC_OR_DEPARTMENT_OTHER)
Admission: EM | Admit: 2017-02-05 | Discharge: 2017-02-05 | Disposition: A | Discharge status: Home / Self Care | Payer: BLUE CROSS/BLUE SHIELD | Attending: Emergency Medicine | Admitting: Emergency Medicine

## 2017-02-05 ENCOUNTER — Encounter (HOSPITAL_BASED_OUTPATIENT_CLINIC_OR_DEPARTMENT_OTHER): Payer: Self-pay

## 2017-02-05 DIAGNOSIS — R0981 Nasal congestion: Secondary | ICD-10-CM

## 2017-02-05 DIAGNOSIS — J0100 Acute maxillary sinusitis, unspecified: Secondary | ICD-10-CM | POA: Diagnosis not present

## 2017-02-05 DIAGNOSIS — R21 Rash and other nonspecific skin eruption: Secondary | ICD-10-CM | POA: Diagnosis not present

## 2017-02-05 MED ORDER — LORATADINE 10 MG PO TABS: 10.0000 mg | ORAL_TABLET | Freq: Every day | ORAL | 0 refills | Status: DC

## 2017-02-05 MED ORDER — FLUTICASONE PROPIONATE 50 MCG/ACT NA SUSP
2.0000 | Freq: Every day | NASAL | 0 refills | Status: DC
Start: 2017-02-05 — End: 2017-06-30

## 2017-02-05 NOTE — Discharge Instructions (Signed)
Your nasal congestion, runny nose and sinus pressure is likely from a viral sinusitis.  Since it has been going on for a couple of days we suspect it is viral and not bacterial and we will treat with conservatively at this point.  Please take loratadine daily and use flonase daily to help with your symptoms.  Use a humidifier and sleep with your head elevated to prevent further congestion.   Monitor your symptoms and follow up with your primary care provider if they do not improve in a total of 7-10 days.  After seven days a viral sinusitis should improve, if not you may have developed a bacterial sinusitis which would require antibiotics.    Return to the emergency department if your symptoms worsen.    Please monitor your rash, at this time it is not painful or ulcerated so we will watch and wait.  Return if you have difficulty breathing, your rash becomes worse or painful.

## 2017-02-05 NOTE — ED Provider Notes (Signed)
[facility]   CSN: [id number] Arrival date & time: 02/05/17  1854   By signing my name below, I, Clarisse Gouge, attest that this documentation has been prepared under the direction and in the presence of Sharen Heck, PA-C. Electronically Signed: Clarisse Gouge, Scribe. 02/05/17. 9:21 PM.   History   Chief Complaint Chief Complaint  Patient presents with  . URI   The history is provided by the patient and medical records. No language interpreter was used.    HPI Comments: Samantha Knapp is a 26 y.o. female who presents to the Emergency Department complaining of worsening cough x 2 days. She notes associated nasal congestion, sinus pressure (R > L and forehead), clear rhinorrhea and a rash on the top of the mouth that she noticed ~20-30 minutes PTA. Pt adds she has only had the rash 1 time before during her childhood when she was sick, she cannot remember further details. Hx of bacterial sinus infections noted. Pt denies sick contacts, sore throat, SOB, chest pain, N/V/D, headache, visual changes, eye pain, headache, and fever.  Past Medical History:  Diagnosis Date  . Frequent UTI   . Migraines     Patient Active Problem List   Diagnosis Date Noted  . Chronic low back pain 08/14/2012    Past Surgical History:  Procedure Laterality Date  . TYMPANOSTOMY TUBE PLACEMENT      OB History    No data available       Home Medications    Prior to Admission medications   Medication Sig Start Date End Date Taking? Authorizing Provider  fluticasone (FLONASE) 50 MCG/ACT nasal spray Place 2 sprays into both nostrils daily. FOR NASAL CONGESTION 02/05/17   Liberty Handy, PA-C  levonorgestrel (MIRENA, 52 MG,) 20 MCG/24HR IUD 1 Intra Uterine Device (1 each total) by Intrauterine route once. 08/31/16 08/31/16  Nelwyn Salisbury, MD  loratadine (CLARITIN) 10 MG tablet Take 1 tablet (10 mg total) by mouth daily. 02/05/17   Liberty Handy, PA-C  oseltamivir  (TAMIFLU) 75 MG capsule Take 1 capsule (75 mg total) by mouth every 12 (twelve) hours. 01/04/17   Glynn Octave, MD    Family History Family History  Problem Relation Age of Onset  . Hyperlipidemia Mother     Social History Social History  Substance Use Topics  . Smoking status: Never Smoker  . Smokeless tobacco: Never Used  . Alcohol use No     Allergies   Patient has no known allergies.   Review of Systems Review of Systems  Constitutional: Negative for chills, diaphoresis and fever.  HENT: Positive for congestion, rhinorrhea and sinus pressure. Negative for sinus pain and sore throat.   Eyes: Negative for visual disturbance.  Respiratory: Positive for cough. Negative for chest tightness and shortness of breath.   Cardiovascular: Negative for chest pain.  Gastrointestinal: Negative for abdominal pain, diarrhea, nausea and vomiting.  Genitourinary: Negative for dysuria and hematuria.  Musculoskeletal: Negative for arthralgias and myalgias.  Skin: Positive for rash.  Allergic/Immunologic: Negative for immunocompromised state.  Neurological: Negative for light-headedness and headaches.  Hematological: Does not bruise/bleed easily.  Psychiatric/Behavioral: Negative for confusion.   Physical Exam Updated Vital Signs BP 120/78 (BP Location: Left Arm)   Pulse 87   Temp 98.4 F (36.9 C) (Oral)   Resp 16   Ht 5\' 5"  (1.651 m)   Wt 139 lb (63 kg)   LMP 02/02/2017   SpO2 99%   BMI 23.13 kg/m  Physical Exam  Constitutional: She is oriented to person, place, and time. She appears well-developed and well-nourished. She is cooperative. She is easily aroused.  Non-toxic appearance. No distress.  HENT:  Head: Normocephalic and atraumatic. Head is without right periorbital erythema and without left periorbital erythema.  Right Ear: External ear normal.  Left Ear: External ear normal.  Nose: Mucosal edema and rhinorrhea present. Right sinus exhibits maxillary sinus  tenderness. Right sinus exhibits no frontal sinus tenderness. Left sinus exhibits no maxillary sinus tenderness and no frontal sinus tenderness.  Mouth/Throat: Oropharynx is clear and moist and mucous membranes are normal. No trismus in the jaw. No uvula swelling. No oropharyngeal exudate, posterior oropharyngeal edema or posterior oropharyngeal erythema. Tonsils are 1+ on the right. Tonsils are 1+ on the left. No tonsillar exudate.    Beefy red, petechiae <1 cm over soft palate, non tender without vesicles or ulcerations, no break in skin noted   Eyes: Conjunctivae, EOM and lids are normal. Pupils are equal, round, and reactive to light. No scleral icterus. Right eye exhibits normal extraocular motion. Left eye exhibits normal extraocular motion.  No perioorbital edema, erythema, tenderness Normal EOMs without pain  Neck: Normal range of motion. Neck supple. No JVD present.  Cardiovascular: Normal rate, regular rhythm and normal heart sounds.   No murmur heard. Pulmonary/Chest: Effort normal and breath sounds normal. No respiratory distress. She has no decreased breath sounds. She has no wheezes. She has no rhonchi. She has no rales.  Abdominal: Soft. There is no tenderness.  Musculoskeletal: Normal range of motion. She exhibits no deformity.  Lymphadenopathy:       Head (right side): No submental, no submandibular, no tonsillar and no preauricular adenopathy present.       Head (left side): No submental, no submandibular, no tonsillar and no preauricular adenopathy present.    She has no cervical adenopathy.       Right cervical: No superficial cervical adenopathy present.      Left cervical: No superficial cervical adenopathy present.  Neurological: She is alert, oriented to person, place, and time and easily aroused.  Skin: Skin is warm and dry. Capillary refill takes less than 2 seconds. Petechiae and rash noted.  Roof of mouth, soft palate  No rash noted on anterior/posterior trunk,  palms of hands or soles of feet  Psychiatric: She has a normal mood and affect. Her behavior is normal. Judgment and thought content normal.  Nursing note and vitals reviewed.    ED Treatments / Results  DIAGNOSTIC STUDIES: Oxygen Saturation is 99% on RA, normal by my interpretation.    COORDINATION OF CARE: 9:21 PM Discussed treatment plan with pt at bedside and pt agreed to plan. Will consult ED physician and reassess.  Labs (all labs ordered are listed, but only abnormal results are displayed) Labs Reviewed - No data to display  EKG  EKG Interpretation None       Radiology No results found.  Procedures Procedures (including critical care time)  Medications Ordered in ED Medications - No data to display   Initial Impression / Assessment and Plan / ED Course  I have reviewed the triage vital signs and the nursing notes.  Pertinent labs & imaging results that were available during my care of the patient were reviewed by me and considered in my medical decision making (see chart for details).    Patient complaining of symptoms of sinusitis.  There is associated beefy red, non tender, non ulcerated petechiae type rash at  roof of mouth. H/o same rash once as child.   Mild symptoms of clear nasal discharge/congestion and mild R maxillary tenderness with percussion for less than 10 days.  Patient is afebrile.  No concern for acute bacterial rhinosinusitis; likely viral in nature.  Patient discharged with symptomatic treatment.  Patient instructions given for warm saline nasal washes.  Recommendations for follow-up with primary care physician.  Patient aware viral sinusitis should improve in 7-10 days total, advised to monitor her symptoms and get re-evaluation if symptoms worsens as this would suggest bacterial etiology and would require abx.  Patient verbalized understanding, is agreeable to conservative management at this point.  No intervention felt to be required at this time  for soft palate rash.  This rash is non tender, without vesicles or ulceration.  Oral airway widely patent, will watch and wait.  Patient agreeable.   I personally performed the services described in this documentation, which was scribed in my presence. The recorded information has been reviewed and is accurate.   Final Clinical Impressions(s) / ED Diagnoses   Final diagnoses:  Nasal congestion  Acute non-recurrent maxillary sinusitis    New Prescriptions Discharge Medication List as of 02/05/2017 10:23 PM    START taking these medications   Details  fluticasone (FLONASE) 50 MCG/ACT nasal spray Place 2 sprays into both nostrils daily. FOR NASAL CONGESTION, Starting Sun 02/05/2017, Print    loratadine (CLARITIN) 10 MG tablet Take 1 tablet (10 mg total) by mouth daily., Starting Sun 02/05/2017, Print         Liberty Handy, PA-C 02/06/17 1346    Rolan Bucco, MD 02/09/17 [phone]

## 2017-02-05 NOTE — ED Notes (Signed)
Pt states she thinks she has a sinus infection. Congestion, clear drainage from nose x 2 days. Also reports she has a red rash to the roof of her mouth. Hx same when younger. Denies other s/s.

## 2017-02-05 NOTE — ED Notes (Signed)
PT given d/c instructions as per chart. Rx x 2. Verbalizes understanding. No questions.

## 2017-02-05 NOTE — ED Notes (Signed)
Gave patient blanket--  temperature within normal range

## 2017-02-05 NOTE — ED Triage Notes (Signed)
Pt reports nasal drainage, congestion and cough x 2 days. Also reports rash in top of mouth.

## 2017-06-30 ENCOUNTER — Encounter (HOSPITAL_BASED_OUTPATIENT_CLINIC_OR_DEPARTMENT_OTHER): Payer: Self-pay

## 2017-06-30 ENCOUNTER — Emergency Department (HOSPITAL_BASED_OUTPATIENT_CLINIC_OR_DEPARTMENT_OTHER)
Admission: EM | Admit: 2017-06-30 | Discharge: 2017-07-01 | Disposition: A | Discharge status: Home / Self Care | Payer: BLUE CROSS/BLUE SHIELD | Attending: Emergency Medicine | Admitting: Emergency Medicine

## 2017-06-30 DIAGNOSIS — R102 Pelvic and perineal pain: Secondary | ICD-10-CM | POA: Insufficient documentation

## 2017-06-30 HISTORY — DX: Other intervertebral disc degeneration, lumbar region without mention of lumbar back pain or lower extremity pain: M51.369

## 2017-06-30 HISTORY — DX: Other intervertebral disc degeneration, lumbar region: M51.36

## 2017-06-30 LAB — URINALYSIS, MICROSCOPIC (REFLEX)

## 2017-06-30 LAB — URINALYSIS, ROUTINE W REFLEX MICROSCOPIC
BILIRUBIN URINE: NEGATIVE
GLUCOSE, UA: NEGATIVE mg/dL
KETONES UR: NEGATIVE mg/dL
LEUKOCYTES UA: NEGATIVE
Nitrite: NEGATIVE
PROTEIN: NEGATIVE mg/dL
Specific Gravity, Urine: 1.025 (ref 1.005–1.030)
pH: 6 (ref 5.0–8.0)

## 2017-06-30 LAB — PREGNANCY, URINE: Preg Test, Ur: NEGATIVE

## 2017-06-30 NOTE — ED Notes (Signed)
Pt states she can't give urine specimen at this time.

## 2017-06-30 NOTE — ED Triage Notes (Signed)
C/o vaginal pain x 1 week-pain started the day before LMP-NAD-steady gait

## 2017-07-01 LAB — WET PREP, GENITAL
CLUE CELLS WET PREP: NONE SEEN
SPERM: NONE SEEN
TRICH WET PREP: NONE SEEN
Yeast Wet Prep HPF POC: NONE SEEN

## 2017-07-01 MED ORDER — AZITHROMYCIN 250 MG PO TABS
1000.0000 mg | ORAL_TABLET | Freq: Once | ORAL | Status: AC
  Administered 2017-07-01: 1000 mg via ORAL
  Filled 2017-07-01: qty 4

## 2017-07-01 MED ORDER — CEFTRIAXONE SODIUM 250 MG IJ SOLR
250.0000 mg | Freq: Once | INTRAMUSCULAR | Status: AC
  Administered 2017-07-01: 250 mg via INTRAMUSCULAR
  Filled 2017-07-01: qty 250

## 2017-07-01 NOTE — ED Provider Notes (Signed)
[facility]   CSN: [id number] Arrival date & time: 06/30/17  2101     History   Chief Complaint Chief Complaint  Patient presents with  . Vaginal Pain    HPI Samantha Knapp is a 26 y.o. female who complains of constant, worsening, sharp vaginal pain x1 week. She denies dysuria, frequency, hesitancy, vaginal discharge or odor, dyspareunia, vaginal itching, abdominal pain, back pain, N/V/D. She reports her LMP ended 4 days ago. No fever or chills. No pertinent past medical history her daily medications. The patient reports that she is in a monogamous relationship with a female sexual partner. No history of STI. She reports that she had an IUD placed in April 2017. No difficulty since that time. She reports that she is established with an OB/GYN, but has not been seen for this problem. No treatment prior to arrival.  The history is provided by the patient. No language interpreter was used.    Past Medical History:  Diagnosis Date  . DDD (degenerative disc disease), lumbar   . Frequent UTI   . Migraines     Patient Active Problem List   Diagnosis Date Noted  . Chronic low back pain 08/14/2012    Past Surgical History:  Procedure Laterality Date  . TYMPANOSTOMY TUBE PLACEMENT      OB History    No data available       Home Medications    Prior to Admission medications   Medication Sig Start Date End Date Taking? Authorizing Provider  levonorgestrel (MIRENA, 52 MG,) 20 MCG/24HR IUD 1 Intra Uterine Device (1 each total) by Intrauterine route once. 08/31/16 08/31/16  Nelwyn SalisburyFry, Stephen A, MD    Family History Family History  Problem Relation Age of Onset  . Hyperlipidemia Mother     Social History Social History  Substance Use Topics  . Smoking status: Never Smoker  . Smokeless tobacco: Never Used  . Alcohol use Yes     Comment: occ     Allergies   Patient has no known allergies.   Review of Systems Review of Systems    Constitutional: Negative for activity change, chills and fever.  Respiratory: Negative for shortness of breath.   Cardiovascular: Negative for chest pain.  Gastrointestinal: Negative for abdominal pain, diarrhea, nausea and vomiting.  Genitourinary: Positive for vaginal pain. Negative for dyspareunia, dysuria, frequency, menstrual problem, pelvic pain, urgency, vaginal bleeding and vaginal discharge.  Musculoskeletal: Negative for back pain.  Skin: Negative for rash.   Physical Exam Updated Vital Signs BP 115/73 (BP Location: Right Arm)   Pulse 71   Temp 98.5 F (36.9 C) (Oral)   Resp 16   Ht 5\' 5"  (1.651 m)   Wt 62.6 kg (138 lb)   LMP 06/26/2017   SpO2 100%   BMI 22.96 kg/m   Physical Exam  Constitutional: She appears well-developed and well-nourished. No distress.  HENT:  Head: Normocephalic.  Eyes: Conjunctivae are normal.  Neck: Neck supple.  Cardiovascular: Normal rate and regular rhythm.  Exam reveals no gallop and no friction rub.   No murmur heard. Pulmonary/Chest: Effort normal. No respiratory distress.  Abdominal: Soft. Bowel sounds are normal. She exhibits no distension. There is no tenderness. There is no rebound and no guarding.  Genitourinary:  Genitourinary Comments: Chaperoned exam. Yellow discharge noted in the vaginal vault. Cervix appeared friable and began bleeding with collection of the specimen. Mild cervical motion tenderness noted. No adnexal tenderness.   Neurological: She is alert.  Skin: Skin is warm. No rash noted. She is not diaphoretic.  Psychiatric: Her behavior is normal.  Nursing note and vitals reviewed.  ED Treatments / Results  Labs (all labs ordered are listed, but only abnormal results are displayed) Labs Reviewed  WET PREP, GENITAL - Abnormal; Notable for the following:       Result Value   WBC, Wet Prep HPF POC MANY (*)    All other components within normal limits  URINALYSIS, ROUTINE W REFLEX MICROSCOPIC - Abnormal; Notable for  the following:    Hgb urine dipstick MODERATE (*)    All other components within normal limits  URINALYSIS, MICROSCOPIC (REFLEX) - Abnormal; Notable for the following:    Bacteria, UA FEW (*)    Squamous Epithelial / LPF 0-5 (*)    All other components within normal limits  PREGNANCY, URINE  GC/CHLAMYDIA PROBE AMP ([place]) NOT AT [facility]    EKG  EKG Interpretation None       Radiology No results found.  Procedures Procedures (including critical care time)  Medications Ordered in ED Medications  azithromycin (ZITHROMAX) tablet 1,000 mg (1,000 mg Oral Given 07/01/17 0125)  cefTRIAXone (ROCEPHIN) injection 250 mg (250 mg Intramuscular Given 07/01/17 0127)     Initial Impression / Assessment and Plan / ED Course  I have reviewed the triage vital signs and the nursing notes.  Pertinent labs & imaging results that were available during my care of the patient were reviewed by me and considered in my medical decision making (see chart for details).     Patient presenting with worsening vaginal pain over the last week. Afebrile. Nontachy. UA unremarkable for urinary tract infection, and the patient is asymptomatic. On physical exam, yellow discharge is noted in the vaginal vault and the cervix began spontaneously bleeding during endocervical swab specimen collection. Moderate CMT. Given the patient's physical exam and vitals, the patient does not appear to have PID at this time. Wet prep demonstrating many WBCs. Negative for trichomonas, yeast, or clue cells. Pregnancy test negative. Gonorrhea and chlamydia are pending. Discussed the findings with the patient who is agreeable for treatment with azithromycin and ceftriaxone. Discussed that the results are pending, but if positive she will receive a call. She acknowledges that she should let all sexual partners know if she is positive for gonorrhea or chlamydia. Encourage the patient to follow up with her OB/GYN if symptoms persist and the  pending labs are negative. She acknowledges and is agreeable with the plan at this time. Strict return precautions given. No acute distress. The patient is safe for discharge at this time.  Final Clinical Impressions(s) / ED Diagnoses   Final diagnoses:  Vaginal pain    New Prescriptions Discharge Medication List as of 07/01/2017  1:14 AM       Frederik PearMcDonald, Rhylin Venters A, PA-C 07/01/17 0202    Molpus, John, MD 07/01/17 [id number]

## 2017-07-01 NOTE — ED Notes (Signed)
ED Provider at bedside. 

## 2017-07-01 NOTE — Discharge Instructions (Signed)
Your gonorrhea and chlamydia labs are pending. If positive you will receive a call from the lab; however, you have already been treated. You can also check the app MyChart for the results. If these results come back negative and you continue to have symptoms, please follow up with your OBGYN since you have an IUD in place. Please do not have sexual intercourse for the next week after you have been treated with these medications.  If you develop new or worsening symptoms, including severe pelvic pain, fever, or chills, please return to the Emergency Department for re-evaluation.

## 2017-07-03 LAB — GC/CHLAMYDIA PROBE AMP (~~LOC~~) NOT AT ARMC
Chlamydia: POSITIVE — AB
Neisseria Gonorrhea: NEGATIVE

## 2017-07-04 ENCOUNTER — Telehealth (HOSPITAL_BASED_OUTPATIENT_CLINIC_OR_DEPARTMENT_OTHER): Payer: Self-pay | Admitting: *Deleted

## 2017-08-17 ENCOUNTER — Encounter: Payer: Self-pay | Admitting: Family Medicine

## 2018-01-03 DIAGNOSIS — Z20828 Contact with and (suspected) exposure to other viral communicable diseases: Secondary | ICD-10-CM | POA: Diagnosis not present

## 2018-01-03 DIAGNOSIS — J00 Acute nasopharyngitis [common cold]: Secondary | ICD-10-CM | POA: Diagnosis not present

## 2018-08-21 DIAGNOSIS — Z124 Encounter for screening for malignant neoplasm of cervix: Secondary | ICD-10-CM | POA: Diagnosis not present

## 2018-08-21 DIAGNOSIS — Z01419 Encounter for gynecological examination (general) (routine) without abnormal findings: Secondary | ICD-10-CM | POA: Diagnosis not present

## 2018-08-21 DIAGNOSIS — Z113 Encounter for screening for infections with a predominantly sexual mode of transmission: Secondary | ICD-10-CM | POA: Diagnosis not present

## 2018-08-21 DIAGNOSIS — Z6826 Body mass index (BMI) 26.0-26.9, adult: Secondary | ICD-10-CM | POA: Diagnosis not present

## 2018-09-21 DIAGNOSIS — H66001 Acute suppurative otitis media without spontaneous rupture of ear drum, right ear: Secondary | ICD-10-CM | POA: Diagnosis not present

## 2018-09-21 DIAGNOSIS — Z23 Encounter for immunization: Secondary | ICD-10-CM | POA: Diagnosis not present

## 2018-10-31 ENCOUNTER — Ambulatory Visit: Payer: BLUE CROSS/BLUE SHIELD | Admitting: Family Medicine

## 2018-11-05 ENCOUNTER — Ambulatory Visit (INDEPENDENT_AMBULATORY_CARE_PROVIDER_SITE_OTHER): Payer: BLUE CROSS/BLUE SHIELD | Admitting: Family Medicine

## 2018-11-05 ENCOUNTER — Encounter: Payer: Self-pay | Admitting: Family Medicine

## 2018-11-05 VITALS — BP 120/70 | HR 72 | Temp 98.3°F | Wt 168.0 lb

## 2018-11-05 DIAGNOSIS — G8929 Other chronic pain: Secondary | ICD-10-CM

## 2018-11-05 DIAGNOSIS — M545 Low back pain: Secondary | ICD-10-CM

## 2018-11-05 MED ORDER — METHYLPREDNISOLONE 4 MG PO TBPK: ORAL_TABLET | ORAL | 0 refills | Status: DC

## 2018-11-05 NOTE — Progress Notes (Signed)
   Subjective:    Patient ID: Samantha Knapp, female    DOB: 12/03/1990, 27 y.o.   MRN: [id number]  HPI Here for one month of aching low back pain. No radiation to the legs. Using heat and ice. Taking Ibuprofen with mixed results. She had an MRI in 2013 showing mild disc herinations at L4-5 and L5-S1.    Review of Systems  Constitutional: Negative.   Respiratory: Negative.   Cardiovascular: Negative.   Genitourinary: Negative.   Musculoskeletal: Positive for back pain.       Objective:   Physical Exam  Constitutional: She is oriented to person, place, and time. She appears well-developed and well-nourished. No distress.  Cardiovascular: Normal rate, regular rhythm, normal heart sounds and intact distal pulses.  Pulmonary/Chest: Effort normal and breath sounds normal.  Musculoskeletal:  Mildly tender in the lower back with no spasm and full ROM. Negative SLR.   Neurological: She is alert and oriented to person, place, and time.          Assessment & Plan:  Low back pain, treat with a Medrol dose pack.  Gershon CraneStephen Fry, MD

## 2018-12-05 ENCOUNTER — Encounter: Payer: Self-pay | Admitting: Family Medicine

## 2018-12-05 ENCOUNTER — Ambulatory Visit (INDEPENDENT_AMBULATORY_CARE_PROVIDER_SITE_OTHER): Payer: BLUE CROSS/BLUE SHIELD | Admitting: Family Medicine

## 2018-12-05 VITALS — BP 120/60 | HR 72 | Temp 98.6°F | Wt 166.4 lb

## 2018-12-05 DIAGNOSIS — M545 Low back pain, unspecified: Secondary | ICD-10-CM

## 2018-12-05 DIAGNOSIS — G8929 Other chronic pain: Secondary | ICD-10-CM | POA: Diagnosis not present

## 2018-12-05 MED ORDER — MELOXICAM 15 MG PO TABS: 15.0000 mg | ORAL_TABLET | Freq: Every day | ORAL | 5 refills | Status: DC

## 2018-12-05 NOTE — Progress Notes (Signed)
   Subjective:    Patient ID: Samantha Knapp, female    DOB: July 19, 1991, 28 y.o.   MRN: [id number]  HPI Here for low back pain. She was seen here in early December and she took a prednisone taper. This helped her pain a lot at first, but then it returned somewhat. It is less intense than before. Still no radiation to the legs.    Review of Systems  Constitutional: Negative.   Respiratory: Negative.   Cardiovascular: Negative.   Musculoskeletal: Positive for back pain.       Objective:   Physical Exam Constitutional:      Appearance: Normal appearance.  Cardiovascular:     Rate and Rhythm: Normal rate and regular rhythm.     Pulses: Normal pulses.     Heart sounds: Normal heart sounds.  Pulmonary:     Effort: Pulmonary effort is normal.     Breath sounds: Normal breath sounds.  Musculoskeletal:     Comments: Mildly tender in the lower back with full ROM   Neurological:     Mental Status: She is alert.           Assessment & Plan:  Low back pain. She will try Meloxicam daily, and I advised her to try yoga for strengthening her core muscles.  Gershon Crane, MD

## 2019-01-10 DIAGNOSIS — S60031A Contusion of right middle finger without damage to nail, initial encounter: Secondary | ICD-10-CM | POA: Diagnosis not present

## 2019-06-04 DIAGNOSIS — Z3042 Encounter for surveillance of injectable contraceptive: Secondary | ICD-10-CM | POA: Diagnosis not present

## 2019-06-04 DIAGNOSIS — Z6827 Body mass index (BMI) 27.0-27.9, adult: Secondary | ICD-10-CM | POA: Diagnosis not present

## 2019-06-04 DIAGNOSIS — Z30432 Encounter for removal of intrauterine contraceptive device: Secondary | ICD-10-CM | POA: Diagnosis not present

## 2019-06-14 ENCOUNTER — Ambulatory Visit: Payer: Self-pay

## 2019-06-14 ENCOUNTER — Telehealth: Payer: Self-pay | Admitting: Family Medicine

## 2019-06-14 NOTE — Telephone Encounter (Signed)
Attempted to call pt however got a message that her voicemail box was full.

## 2019-06-14 NOTE — Telephone Encounter (Signed)
Patient called, mailbox is full, unable to leave a message.   Message from Samantha Knapp sent at 06/14/2019 1:14 PM EDT  Patient is calling for advice she got stuck with metal pin out of a chair on her leg. It is red and swollen. She is asking for advice on what she should do. Please advise. Thank you

## 2019-06-14 NOTE — Telephone Encounter (Signed)
I attempted to return pt's call (this is the 3rd attempt to reach her) however again got her mailbox which is full.   Unable to contact. I will forward this to Dr. Barbie Banner office per protocol after 3 failed attempts to contact pt.

## 2019-06-18 NOTE — Telephone Encounter (Signed)
Please advise. We have been unable to contact the patient by phone. I will send a mychart message with your recommendations.

## 2019-06-19 NOTE — Telephone Encounter (Signed)
We will just have to wait for her to contact us again

## 2019-11-19 ENCOUNTER — Other Ambulatory Visit: Payer: BLUE CROSS/BLUE SHIELD

## 2020-08-05 ENCOUNTER — Encounter: Payer: Self-pay | Admitting: Family Medicine

## 2020-08-05 ENCOUNTER — Telehealth (INDEPENDENT_AMBULATORY_CARE_PROVIDER_SITE_OTHER): Payer: Self-pay | Admitting: Family Medicine

## 2020-08-05 VITALS — Ht 65.0 in | Wt 160.0 lb

## 2020-08-05 DIAGNOSIS — J019 Acute sinusitis, unspecified: Secondary | ICD-10-CM

## 2020-08-05 MED ORDER — AMOXICILLIN-POT CLAVULANATE 875-125 MG PO TABS: 1.0000 | ORAL_TABLET | Freq: Two times a day (BID) | ORAL | 0 refills | Status: DC

## 2020-08-05 NOTE — Progress Notes (Signed)
   Subjective:    Patient ID: Samantha Knapp, female    DOB: Jun 27, 1991, 29 y.o.   MRN: [id number]  HPI Virtual Visit via Video Note  I connected with the patient on 08/05/20 at  2:30 PM EDT by a video enabled telemedicine application and verified that I am speaking with the correct person using two identifiers.  Location patient: home Location provider:work or home office Persons participating in the virtual visit: patient, provider  I discussed the limitations of evaluation and management by telemedicine and the availability of in person appointments. The patient expressed understanding and agreed to proceed.   HPI: Here for one week of sinus congestion, right ear pain, and swollen nodes in the right neck area. No fever or headache or ST or cough. No SOB or body aches or NVD. Taking Sudafed and Mucinex and Flonase.    ROS: See pertinent positives and negatives per HPI.  Past Medical History:  Diagnosis Date  . DDD (degenerative disc disease), lumbar   . Frequent UTI   . Migraines     Past Surgical History:  Procedure Laterality Date  . TYMPANOSTOMY TUBE PLACEMENT      Family History  Problem Relation Age of Onset  . Hyperlipidemia Mother      Current Outpatient Medications:  .  levonorgestrel (MIRENA, 52 MG,) 20 MCG/24HR IUD, 1 Intra Uterine Device (1 each total) by Intrauterine route once., Disp: 1 each, Rfl: 0 .  meloxicam (MOBIC) 15 MG tablet, Take 1 tablet (15 mg total) by mouth daily. (Patient not taking: Reported on 08/05/2020), Disp: 30 tablet, Rfl: 5  EXAM:  VITALS per patient if applicable:  GENERAL: alert, oriented, appears well and in no acute distress  HEENT: atraumatic, conjunttiva clear, no obvious abnormalities on inspection of external nose and ears  NECK: normal movements of the head and neck  LUNGS: on inspection no signs of respiratory distress, breathing rate appears normal, no obvious gross SOB, gasping or wheezing  CV: no obvious  cyanosis  MS: moves all visible extremities without noticeable abnormality  PSYCH/NEURO: pleasant and cooperative, no obvious depression or anxiety, speech and thought processing grossly intact  ASSESSMENT AND PLAN: Sinusitis, treat with Augmentin.  Gershon Crane, MD  Discussed the following assessment and plan:  No diagnosis found.     I discussed the assessment and treatment plan with the patient. The patient was provided an opportunity to ask questions and all were answered. The patient agreed with the plan and demonstrated an understanding of the instructions.   The patient was advised to call back or seek an in-person evaluation if the symptoms worsen or if the condition fails to improve as anticipated.     Review of Systems     Objective:   Physical Exam        Assessment & Plan:

## 2020-08-14 ENCOUNTER — Telehealth: Payer: Self-pay | Admitting: Family Medicine

## 2020-08-14 NOTE — Telephone Encounter (Signed)
Please advise 

## 2020-08-14 NOTE — Telephone Encounter (Signed)
Pt call and want to know if dr.Fry can call her something for her gum pain.

## 2020-08-14 NOTE — Telephone Encounter (Signed)
Rinse with warm salt water

## 2020-08-14 NOTE — Telephone Encounter (Signed)
Patient return called about her gums. She said it wasn't so much her gums but her lymph nodes are swollen along her jaw line and it's causing pain to shoot up into her gums. She says she's tried orajel and ibuprofen with no relief. I advised Dr. Clent Ridges and he said to have patient go to UC. I advised the patient, she verbalized understanding.

## 2020-08-15 ENCOUNTER — Ambulatory Visit
Admission: EM | Admit: 2020-08-15 | Discharge: 2020-08-15 | Disposition: A | Discharge status: Home / Self Care | Payer: Self-pay | Attending: Emergency Medicine | Admitting: Emergency Medicine

## 2020-08-15 ENCOUNTER — Other Ambulatory Visit: Payer: Self-pay

## 2020-08-15 DIAGNOSIS — R590 Localized enlarged lymph nodes: Secondary | ICD-10-CM

## 2020-08-15 MED ORDER — PREDNISONE 20 MG PO TABS: 20.0000 mg | ORAL_TABLET | Freq: Two times a day (BID) | ORAL | 0 refills | Status: AC

## 2020-08-15 MED ORDER — MELOXICAM 15 MG PO TABS: 15.0000 mg | ORAL_TABLET | Freq: Every day | ORAL | 0 refills | Status: DC

## 2020-08-15 NOTE — ED Triage Notes (Signed)
Pt presents with swollen painful lymph node on right side of neck  For a couple weeks

## 2020-08-15 NOTE — Discharge Instructions (Signed)
Push fluids and get rest Prednisone prescribed.  Take as directed and to completion for pain and swelling Mobic prescribed for break-through pain You may also continue with tylenol as needed Continue with antibiotic Follow up with PCP this week for recheck and consider blood work if symptoms are not improving  Return or go to ER if patient has any new or worsening symptoms such as fever, chills, nausea, vomiting, worsening sore throat, cough, abdominal pain, chest pain, changes in bowel or bladder habits, etc..Marland Kitchen

## 2020-08-15 NOTE — ED Provider Notes (Signed)
[facility]   [id number] 08/15/20 Arrival Time: 0915  CC: Lymph node pain  SUBJECTIVE: History from: patient.  Samantha Knapp is a 29 y.o. female who presents with RT sided lymph node pain and swelling x 1 week.  Denies sick exposure or precipitating event. Recently treated with augmentin for sinus infection 1 week ago via televisit.  Advised by PCP to come here and be evaluated in person.  Has tried OTC medications without relief.  Symptoms are made worse to the touch.  Complains of mild residual nasal congestion.  Denies fever, chills, fatigue, weight-loss, ear pain, sinus pain, rhinorrhea, cough, SOB, wheezing, chest pain, nausea, dental pain.    ROS: As per HPI.  All other pertinent ROS negative.     Past Medical History:  Diagnosis Date   DDD (degenerative disc disease), lumbar    Frequent UTI    Migraines    Past Surgical History:  Procedure Laterality Date   TYMPANOSTOMY TUBE PLACEMENT     No Known Allergies No current facility-administered medications on file prior to encounter.   Current Outpatient Medications on File Prior to Encounter  Medication Sig Dispense Refill   amoxicillin-clavulanate (AUGMENTIN) 875-125 MG tablet Take 1 tablet by mouth 2 (two) times daily. 20 tablet 0   levonorgestrel (MIRENA, 52 MG,) 20 MCG/24HR IUD 1 Intra Uterine Device (1 each total) by Intrauterine route once. 1 each 0   Social History   Socioeconomic History   Marital status: Single    Spouse name: Not on file   Number of children: Not on file   Years of education: Not on file   Highest education level: Not on file  Occupational History   Not on file  Tobacco Use   Smoking status: Never Smoker   Smokeless tobacco: Never Used  Substance and Sexual Activity   Alcohol use: Yes    Comment: occ   Drug use: No   Sexual activity: Not on file  Other Topics Concern   Not on file  Social History Narrative   Not on file   Social Determinants of  Health   Financial Resource Strain:    Difficulty of Paying Living Expenses: Not on file  Food Insecurity:    Worried About Running Out of Food in the Last Year: Not on file   Ran Out of Food in the Last Year: Not on file  Transportation Needs:    Lack of Transportation (Medical): Not on file   Lack of Transportation (Non-Medical): Not on file  Physical Activity:    Days of Exercise per Week: Not on file   Minutes of Exercise per Session: Not on file  Stress:    Feeling of Stress : Not on file  Social Connections:    Frequency of Communication with Friends and Family: Not on file   Frequency of Social Gatherings with Friends and Family: Not on file   Attends Religious Services: Not on file   Active Member of Clubs or Organizations: Not on file   Attends Banker Meetings: Not on file   Marital Status: Not on file  Intimate Partner Violence:    Fear of Current or Ex-Partner: Not on file   Emotionally Abused: Not on file   Physically Abused: Not on file   Sexually Abused: Not on file   Family History  Problem Relation Age of Onset   Hyperlipidemia Mother     OBJECTIVE:  Vitals:   08/15/20 0953  BP: 135/78  Pulse: 85  Resp: 20  Temp: 98.4 F (36.9 C)  SpO2: 97%     General appearance: alert; mildly fatigued appearing, nontoxic; speaking in full sentences and tolerating own secretions HEENT: NCAT; Ears: EACs clear, TMs pearly gray; Eyes: PERRL.  EOM grossly intact. Nose: nares patent without rhinorrhea, Throat: oropharynx clear, tonsils non erythematous or enlarged, uvula midline  Neck: RT submandibular LAD, no area of induration, overlying erythema, or palpable masses Lungs: unlabored respirations, symmetrical air entry; cough: absent; no respiratory distress; CTAB Heart: regular rate and rhythm.  Skin: warm and dry Psychological: alert and cooperative; normal mood and affect   ASSESSMENT & PLAN:  1. LAD (lymphadenopathy),  submandibular     Meds ordered this encounter  Medications   predniSONE (DELTASONE) 20 MG tablet    Sig: Take 1 tablet (20 mg total) by mouth 2 (two) times daily with a meal for 5 days.    Dispense:  10 tablet    Refill:  0    Order Specific Question:   Supervising Provider    Answer:   Eustace Moore [[id number]]   meloxicam (MOBIC) 15 MG tablet    Sig: Take 1 tablet (15 mg total) by mouth daily.    Dispense:  20 tablet    Refill:  0    Order Specific Question:   Supervising Provider    Answer:   Eustace Moore [[id number]]    Push fluids and get rest Prednisone prescribed.  Take as directed and to completion for pain and swelling Mobic prescribed for break-through pain You may also continue with tylenol as needed Continue with antibiotic Follow up with PCP this week for recheck and consider blood work if symptoms are not improving  Return or go to ER if patient has any new or worsening symptoms such as fever, chills, nausea, vomiting, worsening sore throat, cough, abdominal pain, chest pain, changes in bowel or bladder habits, etc...  Reviewed expectations re: course of current medical issues. Questions answered. Outlined signs and symptoms indicating need for more acute intervention. Patient verbalized understanding. After Visit Summary given.        Rennis Harding, PA-C 08/15/20 1014

## 2020-11-17 ENCOUNTER — Ambulatory Visit
Admission: EM | Admit: 2020-11-17 | Discharge: 2020-11-17 | Discharge status: Absent without leave | Payer: BLUE CROSS/BLUE SHIELD

## 2020-11-17 ENCOUNTER — Telehealth (INDEPENDENT_AMBULATORY_CARE_PROVIDER_SITE_OTHER): Payer: BLUE CROSS/BLUE SHIELD | Admitting: Family Medicine

## 2020-11-17 ENCOUNTER — Other Ambulatory Visit: Payer: Self-pay

## 2020-11-17 DIAGNOSIS — R0981 Nasal congestion: Secondary | ICD-10-CM | POA: Diagnosis not present

## 2020-11-17 MED ORDER — DOXYCYCLINE HYCLATE 100 MG PO TABS: 100.0000 mg | ORAL_TABLET | Freq: Two times a day (BID) | ORAL | 0 refills | Status: DC

## 2020-11-17 NOTE — Patient Instructions (Signed)
  HOME CARE TIPS:  -consider a repeat covid test  -I sent the medication(s) we discussed to your pharmacy: Meds ordered this encounter  Medications  . doxycycline (VIBRA-TABS) 100 MG tablet    Sig: Take 1 tablet (100 mg total) by mouth 2 (two) times daily.    Dispense:  20 tablet    Refill:  0     -can use tylenol or aleve if needed for fevers, aches and pains per instructions  -can use nasal saline a few times per day if nasal congestion  -if chronic nasal congestion, can try flonase 2 sprays each nostril daily for 1 month, then 1 spray each nostril daily  -humidifier at night, clean properly  -stop the afrin after 3 days of use  -stay hydrated, drink plenty of fluids and eat small healthy meals - avoid dairy  -follow up with your doctor in 2-3 days unless improving and feeling better  -stay home while sick, except to seek medical care  It was nice to meet you today, and I really hope you are feeling better soon. I help [place] out with telemedicine visits on Tuesdays and Thursdays and am available for visits on those days. If you have any concerns or questions following this visit please schedule a follow up visit with your Primary Care doctor or seek care at a local urgent care clinic to avoid delays in care.    Seek in person care promptly if your symptoms worsen, new concerns arise or you are not improving with treatment. Call 911 and/or seek emergency care if you symptoms are severe or life threatening.

## 2020-11-17 NOTE — Progress Notes (Signed)
Virtual Visit via Video Note  I connected with Samantha Knapp  on 11/17/20 at  5:00 PM EST by a video enabled telemedicine application and verified that I am speaking with the correct person using two identifiers.  Location patient: home, [place] Location provider:work or home office Persons participating in the virtual visit: patient, provider  I discussed the limitations of evaluation and management by telemedicine and the availability of in person appointments. The patient expressed understanding and agreed to proceed.   HPI:  Acute telemedicine visit for congestion: -Onset: chronic, but worsening about 1 week ago -Symptoms include: nasal congestion, sore throat, R ear feels full, now getting worse with some sinus discomfort on the R maxillary area, sneezing -Denies: fevers, NVD, sob, CP, body aches, cough -? Of possible covid exposure at work -did covid test today which was negative -Has tried: afrin x 3 days, musinex, sudafed, dayquil -Pertinent past medical history: reports hx of sinus infection -Pertinent medication allergies: knda -COVID-19 vaccine status: fully vaccinated for covid, did not have flu shot -FDLMP: a few weeks ago  ROS: See pertinent positives and negatives per HPI.  Past Medical History:  Diagnosis Date  . DDD (degenerative disc disease), lumbar   . Frequent UTI   . Migraines     Past Surgical History:  Procedure Laterality Date  . TYMPANOSTOMY TUBE PLACEMENT       Current Outpatient Medications:  .  doxycycline (VIBRA-TABS) 100 MG tablet, Take 1 tablet (100 mg total) by mouth 2 (two) times daily., Disp: 20 tablet, Rfl: 0 .  levonorgestrel (MIRENA, 52 MG,) 20 MCG/24HR IUD, 1 Intra Uterine Device (1 each total) by Intrauterine route once., Disp: 1 each, Rfl: 0  EXAM:  VITALS per patient if applicable:  GENERAL: alert, oriented, appears well and in no acute distress  HEENT: atraumatic, conjunttiva clear, no obvious abnormalities on inspection of external  nose and ears  NECK: normal movements of the head and neck  LUNGS: on inspection no signs of respiratory distress, breathing rate appears normal, no obvious gross SOB, gasping or wheezing  CV: no obvious cyanosis  MS: moves all visible extremities without noticeable abnormality  PSYCH/NEURO: pleasant and cooperative, no obvious depression or anxiety, speech and thought processing grossly intact  ASSESSMENT AND PLAN:  Discussed the following assessment and plan:  Nasal congestion  -we discussed possible serious and likely etiologies, options for evaluation and workup, limitations of telemedicine visit vs in person visit, treatment, treatment risks and precautions. Pt prefers to treat via telemedicine empirically rather than in person at this moment.  Query sinusitis given worsening symptoms overlying chronic underlying nasal congestion, versus acute viral illness versus other.  She opted to try nasal saline, adding Flonase, humidifier and analgesics with delayed antibiotic prescription of Doxy 100 mg twice daily for 7 to 10 days if symptoms worsen or do not improve over the next few days.  She had a possible Covid exposure.  One at home Covid test was negative.  Did suggest follow-up PCR testing, or at minimum repeat rapid testing and staying home while sick.  She is agreeable. Work/School slipped offered:declined Scheduled follow up with PCP offered: Agrees to follow-up if needed Advised to seek prompt in person care if worsening, new symptoms arise, or if is not improving with treatment. Discussed options for inperson care if PCP office not available. Did let this patient know that I only do telemedicine on Tuesdays and Thursdays for [place]. Advised to schedule follow up visit with PCP or [facility] if any  further questions or concerns to avoid delays in care.   I discussed the assessment and treatment plan with the patient. The patient was provided an opportunity to ask questions and all were  answered. The patient agreed with the plan and demonstrated an understanding of the instructions.     Terressa Koyanagi, DO

## 2020-11-18 ENCOUNTER — Other Ambulatory Visit: Payer: Self-pay

## 2020-11-18 ENCOUNTER — Emergency Department (HOSPITAL_COMMUNITY)
Admission: EM | Admit: 2020-11-18 | Discharge: 2020-11-19 | Disposition: A | Discharge status: Home / Self Care | Payer: BLUE CROSS/BLUE SHIELD | Attending: Emergency Medicine | Admitting: Emergency Medicine

## 2020-11-18 ENCOUNTER — Encounter (HOSPITAL_COMMUNITY): Payer: Self-pay

## 2020-11-18 DIAGNOSIS — Z20822 Contact with and (suspected) exposure to covid-19: Secondary | ICD-10-CM | POA: Diagnosis not present

## 2020-11-18 DIAGNOSIS — J0101 Acute recurrent maxillary sinusitis: Secondary | ICD-10-CM | POA: Diagnosis not present

## 2020-11-18 DIAGNOSIS — R0981 Nasal congestion: Secondary | ICD-10-CM | POA: Diagnosis not present

## 2020-11-18 MED ORDER — DOXYCYCLINE HYCLATE 100 MG PO TABS
100.0000 mg | ORAL_TABLET | Freq: Once | ORAL | Status: AC
  Administered 2020-11-18: 100 mg via ORAL
  Filled 2020-11-18: qty 1

## 2020-11-18 MED ORDER — DOXYCYCLINE HYCLATE 100 MG PO TABS: 100.0000 mg | ORAL_TABLET | Freq: Two times a day (BID) | ORAL | 0 refills | Status: DC

## 2020-11-18 NOTE — Discharge Instructions (Signed)
Continue using Flonase.  Take loratadine (Claritin) or cetirizine (Zyrtec) once a day.  Take the antibiotic (doxycycline) twice a day for the next two weeks.  Follow up with the ENT physician.  Check MyChart for the results of the COVID test. If it is positive, you will need to quarantine for one week.

## 2020-11-18 NOTE — ED Triage Notes (Signed)
R side congestion which has been increasing over the last week with R sided facial pain.

## 2020-11-18 NOTE — ED Provider Notes (Signed)
[facility] EMERGENCY DEPARTMENT Provider Note   CSN: [id number] Arrival date & time: 11/18/20  2155   History Chief Complaint  Patient presents with  . Nasal Congestion    Samantha Knapp is a 29 y.o. female.  The history is provided by the patient.  She has been having difficulty with nasal congestion for the last year, and was treated for sinusitis about 3 months ago with a course of Augmentin which did not help her, followed by a steroid which did help.  Over the last week, nasal congestion has gotten worse.  She is complaining of pain on the right side of her face.  She is also complaining of pain radiating into both ears.  She denies fever or chills.  There has been clear rhinorrhea and postnasal drainage.  She denies any sore throat or cough.  There has been no change in sense of smell or taste.  She did have passing exposure to someone who was known to be Covid positive.  She denies arthralgias or myalgias.  She had a virtual visit with her primary care provider yesterday who started her on Flonase.  She has been vaccinated against COVID-19 but has not received the booster dose.  Past Medical History:  Diagnosis Date  . DDD (degenerative disc disease), lumbar   . Frequent UTI   . Migraines    Has improved.    Patient Active Problem List   Diagnosis Date Noted  . Chronic low back pain 08/14/2012    Past Surgical History:  Procedure Laterality Date  . TYMPANOSTOMY TUBE PLACEMENT       OB History   No obstetric history on file.     Family History  Problem Relation Age of Onset  . Hyperlipidemia Mother     Social History   Tobacco Use  . Smoking status: Never Smoker  . Smokeless tobacco: Never Used  Substance Use Topics  . Alcohol use: Yes    Comment: occ  . Drug use: No    Home Medications Prior to Admission medications   Medication Sig Start Date End Date Taking? Authorizing Provider  doxycycline (VIBRA-TABS) 100 MG tablet Take 1 tablet (100  mg total) by mouth 2 (two) times daily. 11/17/20   Terressa Koyanagi, DO  levonorgestrel (MIRENA, 52 MG,) 20 MCG/24HR IUD 1 Intra Uterine Device (1 each total) by Intrauterine route once. 08/31/16 12/05/18  Nelwyn Salisbury, MD    Allergies    Patient has no known allergies.  Review of Systems   Review of Systems  All other systems reviewed and are negative.   Physical Exam Updated Vital Signs BP 137/75   Pulse 97   Temp 98.2 F (36.8 C) (Oral)   Resp 19   Ht 5\' 5"  (1.651 m)   Wt 71.7 kg   SpO2 100%   BMI 26.29 kg/m   Physical Exam Vitals and nursing note reviewed.   29 year old female, resting comfortably and in no acute distress. Vital signs are normal. Oxygen saturation is 100%, which is normal. Head is normocephalic and atraumatic. PERRLA, EOMI. Oropharynx is clear.  Tympanic membranes are clear.  There is tenderness palpation over the right maxillary sinus but not the left maxillary sinus and not the frontal sinuses.  There is no edema of the turbinates and no obvious nasal drainage. Neck is nontender and supple without adenopathy or JVD. Back is nontender and there is no CVA tenderness. Lungs are clear without rales, wheezes, or rhonchi. Chest is  nontender. Heart has regular rate and rhythm without murmur. Abdomen is soft, flat, nontender without masses or hepatosplenomegaly and peristalsis is normoactive. Extremities have no cyanosis or edema, full range of motion is present. Skin is warm and dry without rash. Neurologic: Mental status is normal, cranial nerves are intact, there are no motor or sensory deficits.  ED Results / Procedures / Treatments   Labs (all labs ordered are listed, but only abnormal results are displayed) Labs Reviewed  RESP PANEL BY RT-PCR (FLU A&B, COVID) [id number]   Procedures Procedures  Medications Ordered in ED Medications  doxycycline (VIBRA-TABS) tablet 100 mg (100 mg Oral Given 11/18/20 2333)    ED Course  I have reviewed the triage  vital signs and the nursing notes.  Pertinent lab results that were available during my care of the patient were reviewed by me and considered in my medical decision making (see chart for details).  MDM Rules/Calculators/A&P Acute on chronic sinusitis.  I believe that the underlying problem is likely allergic, but feel it is reasonable to treat her for bacterial sinusitis at this point.  Because of exposure to COVID-19, will get respiratory pathogen swab.  Patient did have an negative Covid antigen test as an outpatient.  Old records reviewed confirming televisit yesterday.  Patient is advised to continue using Flonase, add cetirizine or loratadine.  She is also referred to ENT for follow-up.  Samantha Knapp was evaluated in Emergency Department on 11/18/2020 for the symptoms described in the history of present illness. She was evaluated in the context of the global COVID-19 pandemic, which necessitated consideration that the patient might be at risk for infection with the SARS-CoV-2 virus that causes COVID-19. Institutional protocols and algorithms that pertain to the evaluation of patients at risk for COVID-19 are in a state of rapid change based on information released by regulatory bodies including the CDC and federal and state organizations. These policies and algorithms were followed during the patient's care in the ED.  Final Clinical Impression(s) / ED Diagnoses Final diagnoses:  Acute recurrent maxillary sinusitis    Rx / DC Orders ED Discharge Orders         Ordered    doxycycline (VIBRA-TABS) 100 MG tablet  2 times daily        11/18/20 2321           Dione Booze, MD 11/19/20 [phone]

## 2020-11-19 LAB — RESP PANEL BY RT-PCR (FLU A&B, COVID) ARPGX2
Influenza A by PCR: NEGATIVE
Influenza B by PCR: NEGATIVE
SARS Coronavirus 2 by RT PCR: NEGATIVE

## 2021-06-01 ENCOUNTER — Inpatient Hospital Stay (HOSPITAL_COMMUNITY)
Admission: AD | Admit: 2021-06-01 | Discharge: 2021-06-01 | Disposition: A | Discharge status: Home / Self Care | Payer: BC Managed Care – PPO | Attending: Obstetrics and Gynecology | Admitting: Obstetrics and Gynecology

## 2021-06-01 ENCOUNTER — Other Ambulatory Visit: Payer: Self-pay

## 2021-06-01 ENCOUNTER — Ambulatory Visit (INDEPENDENT_AMBULATORY_CARE_PROVIDER_SITE_OTHER): Payer: Self-pay | Admitting: Family Medicine

## 2021-06-01 ENCOUNTER — Encounter (HOSPITAL_COMMUNITY): Payer: Self-pay

## 2021-06-01 VITALS — BP 131/84 | Ht 65.0 in | Wt 146.2 lb

## 2021-06-01 DIAGNOSIS — Z3201 Encounter for pregnancy test, result positive: Secondary | ICD-10-CM

## 2021-06-01 DIAGNOSIS — Z3200 Encounter for pregnancy test, result unknown: Secondary | ICD-10-CM | POA: Insufficient documentation

## 2021-06-01 DIAGNOSIS — Z3687 Encounter for antenatal screening for uncertain dates: Secondary | ICD-10-CM

## 2021-06-01 LAB — POCT PREGNANCY, URINE: Preg Test, Ur: POSITIVE — AB

## 2021-06-01 NOTE — MAU Note (Signed)
Samantha Knapp is a 30 y.o. here in MAU reporting: had a + UPT on Friday and Saturday. Needs to apply for medicaid so she needs pregnancy confirmation. No pain, bleeding, or discharge.  LMP: 04/30/21, states it was shorter and lighter then a normal period  Onset of complaint: ongoing  Pain score: 0/10  Vitals:   06/01/21 1332  BP: 129/74  Pulse: 79  Resp: 16  Temp: 98.7 F (37.1 C)  SpO2: 99%     Lab orders placed from triage: none

## 2021-06-01 NOTE — Progress Notes (Signed)
   GYNECOLOGY OFFICE VISIT NOTE  History:   Samantha Knapp is a 30 y.o. No obstetric history on file. here today for pregnancy test.  LMP in May but some irregular bleeding after that Planned and desired pregnancy Denies any other medical problems  Health Maintenance Due  Topic Date Due   COVID-19 Vaccine (1) Never done   HIV Screening  Never done   Hepatitis C Screening  Never done   PAP-Cervical Cytology Screening  Never done   PAP SMEAR-Modifier  Never done    Past Medical History:  Diagnosis Date   DDD (degenerative disc disease), lumbar    Frequent UTI    Migraines    Has improved.    Past Surgical History:  Procedure Laterality Date   TYMPANOSTOMY TUBE PLACEMENT      The following portions of the patient's history were reviewed and updated as appropriate: allergies, current medications, past family history, past medical history, past social history, past surgical history and problem list.   Health Maintenance:   Last pap: No results found for: DIAGPAP, HPV, HPVHIGH   Last mammogram:  N/a   Review of Systems:  Pertinent items noted in HPI and remainder of comprehensive ROS otherwise negative.  Physical Exam:  BP 131/84   Ht 5\' 5"  (1.651 m)   Wt 146 lb 3.2 oz (66.3 kg)   BMI 24.33 kg/m  CONSTITUTIONAL: Well-developed, well-nourished female in no acute distress.  HEENT:  Normocephalic, atraumatic. External right and left ear normal. No scleral icterus.  NECK: Normal range of motion, supple, no masses noted on observation SKIN: No rash noted. Not diaphoretic. No erythema. No pallor. MUSCULOSKELETAL: Normal range of motion. No edema noted. NEUROLOGIC: Alert and oriented to person, place, and time. Normal muscle tone coordination.  PSYCHIATRIC: Normal mood and affect. Normal behavior. Normal judgment and thought content. RESPIRATORY: Effort normal, no problems with respiration noted   Labs and Imaging Results for orders placed or performed in  visit on 06/01/21 (from the past 168 hour(s))  Pregnancy, urine POC   Collection Time: 06/01/21  2:58 PM  Result Value Ref Range   Preg Test, Ur POSITIVE (A) NEGATIVE   No results found.    Assessment and Plan:   Problem List Items Addressed This Visit   None Visit Diagnoses     Unsure of LMP (last menstrual period) as reason for ultrasound scan    -  Primary   Relevant Orders   Pregnancy, urine POC (Completed)   08/02/21 OB LESS THAN 14 WEEKS WITH OB TRANSVAGINAL      #Normal pregnancy Congratulated patient on news. Given irregular bleeding will send her for a dating ultrasound. No other medical issues to address. Given list of OB providers and instructed to schedule new OB visit, prenatals sent to pharmacy.   Routine preventative health maintenance measures emphasized. Please refer to After Visit Summary for other counseling recommendations.   Return for for new OB visit at provider of her choice.    Total face-to-face time with patient: 15 minutes.  Over 50% of encounter was spent on counseling and coordination of care.   [place], MD/MPH Attending Family Medicine Physician, [facility] for [facility], [facility]

## 2021-06-01 NOTE — Patient Instructions (Signed)
Prenatal Care Providers           [facility]  [address] [phone]  [facility]   [address]  [phone]  [facility] [place]       [address] [phone]            [facility] @ [place]     [address] [phone]          [facility] [place]   [address] [phone]  [facility]  [address] [phone]     [facility]  [address]   [phone]     [facility]  Phone: [phone]  [facility]  Phone: [phone]  [facility] Phone: [phone]  [facility] Phone: [phone]  [facility] Phone: [phone]  [facility] Phone: [phone]  [facility]  Phone: [phone]  

## 2021-06-01 NOTE — MAU Provider Note (Signed)
Event Date/Time   First Provider Initiated Contact with Patient 06/01/21 1347      S Samantha Knapp is a 30 y.o. No obstetric history on file. patient who presents to MAU today with complaint of none. Patient is here for a pregnancy confirmation for insurance. Patient denies pain or bleeding.   O BP 129/74 (BP Location: Right Arm)   Pulse 79   Temp 98.7 F (37.1 C) (Oral)   Resp 16   Ht 5\' 5"  (1.651 m)   Wt 66.8 kg   LMP 04/30/2021   SpO2 99% Comment: room air  BMI 24.50 kg/m   Patient Vitals for the past 24 hrs:  BP Temp Temp src Pulse Resp SpO2 Height Weight  06/01/21 1332 129/74 98.7 F (37.1 C) Oral 79 16 99 % -- --  06/01/21 1328 -- -- -- -- -- -- 5\' 5"  (1.651 m) 66.8 kg   Physical Exam Vitals and nursing note reviewed.  Constitutional:      General: She is not in acute distress.    Appearance: Normal appearance. She is not ill-appearing, toxic-appearing or diaphoretic.  HENT:     Head: Normocephalic and atraumatic.  Pulmonary:     Effort: Pulmonary effort is normal.  Neurological:     Mental Status: She is alert and oriented to person, place, and time.  Psychiatric:        Mood and Affect: Mood normal.        Behavior: Behavior normal.        Thought Content: Thought content normal.        Judgment: Judgment normal.   A Medical screening exam complete Pt with no concerns  P Discharge from MAU in stable condition List of options for follow-up given, pt directed to [facility] for pregnancy test and pregnancy confirmation letter if positive Warning signs for worsening condition that would warrant emergency follow-up discussed Patient may return to MAU as needed   Leighton Luster, , NP 06/01/2021 1:53 PM

## 2021-06-01 NOTE — Progress Notes (Signed)
Here for pregnancy test which was positive. States LMP 04/07/21 was normal ( bled 5 days( then June 3-5 bled really light,not like normal period. ) Offered dating [place] to verify EDD. Patient agreed with plan .Advised to start prenatal care with provider of her choice- list given.  Advised to start PNV. Scheduled for Dating [place] for first available appointment- 06/03/21. Dr. Crissie Reese in to meet patient since she is new to our practice.  Rikki Smestad,RN

## 2021-06-03 ENCOUNTER — Inpatient Hospital Stay (EMERGENCY_DEPARTMENT_HOSPITAL)
Admission: AD | Admit: 2021-06-03 | Discharge: 2021-06-03 | Disposition: A | Discharge status: Home / Self Care | Payer: BC Managed Care – PPO | Source: Home / Self Care | Attending: Obstetrics & Gynecology | Admitting: Obstetrics & Gynecology

## 2021-06-03 ENCOUNTER — Other Ambulatory Visit: Payer: Self-pay

## 2021-06-03 ENCOUNTER — Ambulatory Visit
Admission: RE | Admit: 2021-06-03 | Discharge: 2021-06-03 | Disposition: A | Discharge status: Home / Self Care | Payer: BC Managed Care – PPO | Source: Ambulatory Visit | Attending: Family Medicine | Admitting: Family Medicine

## 2021-06-03 ENCOUNTER — Encounter (HOSPITAL_COMMUNITY): Payer: Self-pay | Admitting: Obstetrics & Gynecology

## 2021-06-03 ENCOUNTER — Ambulatory Visit (INDEPENDENT_AMBULATORY_CARE_PROVIDER_SITE_OTHER): Payer: Self-pay | Admitting: *Deleted

## 2021-06-03 VITALS — BP 115/65 | HR 86 | Ht 65.0 in | Wt 144.8 lb

## 2021-06-03 DIAGNOSIS — Z3A00 Weeks of gestation of pregnancy not specified: Secondary | ICD-10-CM | POA: Insufficient documentation

## 2021-06-03 DIAGNOSIS — Z3687 Encounter for antenatal screening for uncertain dates: Secondary | ICD-10-CM | POA: Diagnosis not present

## 2021-06-03 DIAGNOSIS — N939 Abnormal uterine and vaginal bleeding, unspecified: Secondary | ICD-10-CM

## 2021-06-03 DIAGNOSIS — O3680X0 Pregnancy with inconclusive fetal viability, not applicable or unspecified: Secondary | ICD-10-CM

## 2021-06-03 DIAGNOSIS — O26899 Other specified pregnancy related conditions, unspecified trimester: Secondary | ICD-10-CM | POA: Insufficient documentation

## 2021-06-03 DIAGNOSIS — O4690 Antepartum hemorrhage, unspecified, unspecified trimester: Secondary | ICD-10-CM | POA: Insufficient documentation

## 2021-06-03 DIAGNOSIS — R109 Unspecified abdominal pain: Secondary | ICD-10-CM | POA: Insufficient documentation

## 2021-06-03 LAB — ABO/RH: ABO/RH(D): O POS

## 2021-06-03 LAB — HCG, QUANTITATIVE, PREGNANCY: hCG, Beta Chain, Quant, S: 9234 m[IU]/mL — ABNORMAL HIGH (ref <5)

## 2021-06-03 LAB — WET PREP, GENITAL
Clue Cells Wet Prep HPF POC: NONE SEEN
Sperm: NONE SEEN
Trich, Wet Prep: NONE SEEN
WBC, Wet Prep HPF POC: NONE SEEN
Yeast Wet Prep HPF POC: NONE SEEN

## 2021-06-03 NOTE — MAU Provider Note (Signed)
None     Chief Complaint:  Abdominal Pain and Vaginal Bleeding   Samantha Knapp is  30 y.o. G1P0 at Unknown presents complaining of Abdominal Pain and Vaginal Bleeding .Had [place] today a few hours ago that showed GS, yolk sac and fetal pole, too small to measure GA> Has had a little more spotting when wiping.    Obstetrical/Gynecological History: OB History     Gravida  1   Para      Term      Preterm      AB      Living         SAB      IAB      Ectopic      Multiple      Live Births             Past Medical History: Past Medical History:  Diagnosis Date   DDD (degenerative disc disease), lumbar    Frequent UTI    Migraines    Has improved.    Past Surgical History: Past Surgical History:  Procedure Laterality Date   TYMPANOSTOMY TUBE PLACEMENT      Family History: Family History  Problem Relation Age of Onset   Hyperlipidemia Mother     Social History: Social History   Tobacco Use   Smoking status: Never   Smokeless tobacco: Never  Substance Use Topics   Alcohol use: Yes    Comment: occ   Drug use: No    Allergies: No Known Allergies  Meds:  No medications prior to admission.    Review of Systems   Constitutional: Negative for fever and chills Eyes: Negative for visual disturbances Respiratory: Negative for shortness of breath, dyspnea Cardiovascular: Negative for chest pain or palpitations  Gastrointestinal: Negative for vomiting, diarrhea and constipation Genitourinary: Negative for dysuria and urgency Musculoskeletal: Negative for back pain, joint pain, myalgias.  Normal ROM  Neurological: Negative for dizziness and headaches    Physical Exam  Blood pressure 123/64, pulse (!) 105, temperature 98.6 F (37 C), temperature source Oral, resp. rate 20, height 5\' 5"  (1.651 m), weight 65.8 kg, last menstrual period 04/30/2021, SpO2 100 %. GENERAL: Well-developed, well-nourished female in no acute distress.  LUNGS:  Normal respiratory effort HEART: Regular rate and rhythm. EXTREMITIES: Nontender, no edema, 2+ distal pulses. DTR's 2+   Labs: Results for orders placed or performed during the hospital encounter of 06/03/21 (from the past 24 hour(s))  hCG, quantitative, pregnancy   Collection Time: 06/03/21  7:31 PM  Result Value Ref Range   hCG, Beta Chain, Quant, S 9,234 (H) <5 mIU/mL  ABO/Rh   Collection Time: 06/03/21  7:31 PM  Result Value Ref Range   ABO/RH(D) O POS    No rh immune globuloin      NOT A RH IMMUNE GLOBULIN CANDIDATE, PT RH POSITIVE Performed at [facility], [address] [place], [place]   Wet prep, genital   Collection Time: 06/03/21  8:03 PM   Specimen: PATH Cytology Cervicovaginal Ancillary Only  Result Value Ref Range   Yeast Wet Prep HPF POC NONE SEEN NONE SEEN   Trich, Wet Prep NONE SEEN NONE SEEN   Clue Cells Wet Prep HPF POC NONE SEEN NONE SEEN   WBC, Wet Prep HPF POC NONE SEEN NONE SEEN   Sperm NONE SEEN    Imaging Studies:  08/04/21 OB LESS THAN 14 WEEKS WITH OB TRANSVAGINAL  Result Date: 06/03/2021 CLINICAL DATA:  Pregnancy EXAM: OBSTETRIC <14 WK [place] AND TRANSVAGINAL OB US TECHNIQUE: Both transabdominal and transvaginal ultrasound examinations were performed for complete evaluation of the gestation as well as the maternal uterus, adnexal regions, and pelvic cul-de-sac. Transvaginal technique was performed to assess early pregnancy. COMPARISON:  None. FINDINGS: Intrauterine gestational sac: Single Yolk sac:  Visualized. Embryo:  Visualized. Cardiac Activity: Not Visualized. CRL: 19.2 mm. Embryo too small for system to assign gestational age Subchorionic hemorrhage:  None visualized. Maternal uterus/adnexae: Uterus and bilateral ovaries are within normal limits. Probable left ovarian corpus luteal cyst. No free fluid within the pelvis. IMPRESSION: Intrauterine pregnancy with embryo measuring 19.2 mm by crown-rump length. Embryo is too small to measure cardiac  activity or for system to assign a gestational age at this time. Recommend follow-up quantitative B-HCG levels and follow-up US in 14 days to assess viability. Electronically Signed   By: Duanne Guess D.O.   On: 06/03/2021 15:10    Assessment: Samantha Knapp is  30 y.o. G1P0 at Unknown presents with spotting in an early IUP.  Plan: Repeat qhcg in 2 days.   Jacklyn Shell 7/7/20229:34 PM

## 2021-06-03 NOTE — MAU Note (Signed)
Presents with c/o VB and left lower abdominal pain.  Reports had spotting prior to ultrasound done today, states continues to spot but heavier.  LMP early June.

## 2021-06-03 NOTE — Progress Notes (Signed)
Here for [place] results.Reviewed [place] with Dr. Alvester Morin. Informed patient US shows live baby but too small to calculate EDD or heart rate. Informed recommend [place] in 2 weeks. Also pt reports light spotting today twice when wipes/ urinated. Advised if has continuous vaginal bleeding like  a period or severe pain to go to [facility] MAU for evaluation . [place] scheduled for 2 weeks on 06/17/21. Patient voices understanding. Starasia Sinko,RN

## 2021-06-04 ENCOUNTER — Encounter: Payer: Self-pay | Admitting: Emergency Medicine

## 2021-06-04 ENCOUNTER — Ambulatory Visit
Admission: EM | Admit: 2021-06-04 | Discharge: 2021-06-04 | Disposition: A | Discharge status: Home / Self Care | Payer: BC Managed Care – PPO | Attending: Emergency Medicine | Admitting: Emergency Medicine

## 2021-06-04 ENCOUNTER — Telehealth: Payer: Self-pay | Admitting: Family Medicine

## 2021-06-04 DIAGNOSIS — K0889 Other specified disorders of teeth and supporting structures: Secondary | ICD-10-CM

## 2021-06-04 DIAGNOSIS — K047 Periapical abscess without sinus: Secondary | ICD-10-CM

## 2021-06-04 LAB — GC/CHLAMYDIA PROBE AMP (~~LOC~~) NOT AT ARMC
Chlamydia: NEGATIVE
Comment: NEGATIVE
Comment: NORMAL
Neisseria Gonorrhea: NEGATIVE

## 2021-06-04 MED ORDER — AMOXICILLIN-POT CLAVULANATE 875-125 MG PO TABS: 1.0000 | ORAL_TABLET | Freq: Two times a day (BID) | ORAL | 0 refills | Status: AC

## 2021-06-04 NOTE — ED Provider Notes (Signed)
[facility]   [id number] 06/04/21 Arrival Time: 1000  CC: DENTAL PAIN  SUBJECTIVE:  Samantha Knapp is a 30 y.o. female who presents with dental pain x few days.  Denies a precipitating event or trauma.  Localizes pain to RT lower dental pain.  Has tried OTC analgesics without relief.  Worse with chewing.  Does not see a dentist regularly.  Reports similar symptoms in the past.  Denies fever, chills, dysphagia, odynophagia, oral or neck swelling, nausea, vomiting, chest pain, SOB.    ROS: As per HPI.  All other pertinent ROS negative.     Past Medical History:  Diagnosis Date   DDD (degenerative disc disease), lumbar    Frequent UTI    Migraines    Has improved.   Past Surgical History:  Procedure Laterality Date   TYMPANOSTOMY TUBE PLACEMENT     No Known Allergies No current facility-administered medications on file prior to encounter.   Current Outpatient Medications on File Prior to Encounter  Medication Sig Dispense Refill   [DISCONTINUED] levonorgestrel (MIRENA, 52 MG,) 20 MCG/24HR IUD 1 Intra Uterine Device (1 each total) by Intrauterine route once. 1 each 0   Social History   Socioeconomic History   Marital status: Married    Spouse name: Not on file   Number of children: Not on file   Years of education: Not on file   Highest education level: Not on file  Occupational History   Not on file  Tobacco Use   Smoking status: Never   Smokeless tobacco: Never  Substance and Sexual Activity   Alcohol use: Yes    Comment: occ   Drug use: No   Sexual activity: Not on file  Other Topics Concern   Not on file  Social History Narrative   Not on file   Social Determinants of Health   Financial Resource Strain: Not on file  Food Insecurity: Not on file  Transportation Needs: Not on file  Physical Activity: Not on file  Stress: Not on file  Social Connections: Not on file  Intimate Partner Violence: Not on file   Family History  Problem  Relation Age of Onset   Hyperlipidemia Mother     OBJECTIVE:  Vitals:   06/04/21 1043  BP: 117/79  Pulse: 96  Resp: 16  Temp: 98.6 F (37 C)  TempSrc: Oral  SpO2: 98%    General appearance: alert; no distress HENT: normocephalic; atraumatic; dentition: fair;  dental caries  over right lower gums without areas of fluctuance Neck: supple without LAD Lungs: normal respirations Skin: warm and dry Psychological: alert and cooperative; normal mood and affect  ASSESSMENT & PLAN:  1. Pain, dental   2. Dental infection     Meds ordered this encounter  Medications   amoxicillin-clavulanate (AUGMENTIN) 875-125 MG tablet    Sig: Take 1 tablet by mouth every 12 (twelve) hours for 10 days.    Dispense:  20 tablet    Refill:  0    Order Specific Question:   Supervising Provider    Answer:   Eustace Moore [[id number]]    Augmentin prescribed.  Take as directed and to completion Recommend soft diet until evaluated by dentist Maintain oral hygiene care Follow up with dentist as soon as possible for further evaluation and treatment  Return or go to the ED if you have any new or worsening symptoms such as fever, chills, difficulty swallowing, painful swallowing, oral or neck swelling, nausea, vomiting, chest pain, SOB,  etc...  Reviewed expectations re: course of current medical issues. Questions answered. Outlined signs and symptoms indicating need for more acute intervention. Patient verbalized understanding. After Visit Summary given.    Rennis Harding, PA-C 06/04/21 1119

## 2021-06-04 NOTE — Telephone Encounter (Signed)
I see she went to urgent care today

## 2021-06-04 NOTE — Telephone Encounter (Signed)
Attempted to call pt to check on how she is doing but mailbox was full,no option of leaving a message on pt mobile number

## 2021-06-04 NOTE — Telephone Encounter (Signed)
Please see 06/04/21 telephone encounter from Dr. Shawnie Pons.

## 2021-06-04 NOTE — ED Triage Notes (Signed)
Pt said x 2 days right lower molar has been hurting and hard to chew and eat.

## 2021-06-04 NOTE — Discharge Instructions (Addendum)
Augmentin prescribed.  Take as directed and to completion Recommend soft diet until evaluated by dentist Maintain oral hygiene care Follow up with dentist as soon as possible for further evaluation and treatment  Return or go to the ED if you have any new or worsening symptoms such as fever, chills, difficulty swallowing, painful swallowing, oral or neck swelling, nausea, vomiting, chest pain, SOB, etc..Marland Kitchen

## 2021-06-04 NOTE — Telephone Encounter (Signed)
Patient called stating that she is pregnant and she is having severe tooth pain and is needing a prescription sent in to [facility] [phone] - [place], [place] - [address] AT [facility] [address]  Patient says that she has been calling around to different dentist offices, and was told to call her OBGYN. Her OBGYN told her to call her primary care doctor and she is just wanting to know if something can be called in. She says she is having trouble eating and sleeping due to the pain.  Please advise.

## 2021-06-04 NOTE — Telephone Encounter (Signed)
Called pt and advised pt to please go to an Urgent Care for immediate evaluation.   Pt verbalized understanding.   Leonette Nutting  06/04/21

## 2021-06-05 ENCOUNTER — Other Ambulatory Visit: Payer: Self-pay

## 2021-06-05 ENCOUNTER — Telehealth: Payer: Self-pay

## 2021-06-05 ENCOUNTER — Inpatient Hospital Stay (HOSPITAL_COMMUNITY)
Admission: AD | Admit: 2021-06-05 | Discharge: 2021-06-05 | Disposition: A | Discharge status: Home / Self Care | Payer: BC Managed Care – PPO | Attending: Obstetrics & Gynecology | Admitting: Obstetrics & Gynecology

## 2021-06-05 DIAGNOSIS — O99611 Diseases of the digestive system complicating pregnancy, first trimester: Secondary | ICD-10-CM

## 2021-06-05 DIAGNOSIS — Z3A01 Less than 8 weeks gestation of pregnancy: Secondary | ICD-10-CM | POA: Diagnosis not present

## 2021-06-05 DIAGNOSIS — O26891 Other specified pregnancy related conditions, first trimester: Secondary | ICD-10-CM | POA: Diagnosis not present

## 2021-06-05 DIAGNOSIS — Z3491 Encounter for supervision of normal pregnancy, unspecified, first trimester: Secondary | ICD-10-CM

## 2021-06-05 DIAGNOSIS — K0889 Other specified disorders of teeth and supporting structures: Secondary | ICD-10-CM | POA: Diagnosis not present

## 2021-06-05 MED ORDER — OXYCODONE-ACETAMINOPHEN 5-325 MG PO TABS: 1.0000 | ORAL_TABLET | Freq: Four times a day (QID) | ORAL | 0 refills | Status: DC | PRN

## 2021-06-05 MED ORDER — OXYCODONE-ACETAMINOPHEN 5-325 MG PO TABS
2.0000 | ORAL_TABLET | Freq: Once | ORAL | Status: AC
  Administered 2021-06-05: 2 via ORAL
  Filled 2021-06-05: qty 2

## 2021-06-05 MED ORDER — LIDOCAINE HCL URETHRAL/MUCOSAL 2 % EX GEL
1.0000 "application " | Freq: Once | CUTANEOUS | Status: AC
  Administered 2021-06-05: 1 via TOPICAL
  Filled 2021-06-05: qty 6

## 2021-06-05 NOTE — MAU Note (Signed)
Pt reports to mau for follow up lab work.  Pt denies bleeding or cramping today.  Reports tooth ache on right side of mouth.  States she was seen yesterday for tooth pain at an urgent care and given RX for an antibiotic. Pt states she was told to take tylenol for pain, but states it has not helped.  Last dose of tylenol was taken at 0130 this morning.

## 2021-06-05 NOTE — Discharge Instructions (Signed)
  Dental Resources   [place]   Dr. Carl Little  Exam $85   [address]  Extraction $120 and up   [place], [place]  *full list of prices available*   [phone]      [facility]  Exam $94   [address]  Exam w/ Xrays $380   [place], [place]  Xrays $68 and up   [phone]  Cleaning $101   Extraction $190 and up      [facility]  Cleaning + Xray $344   [address]  Extraction- pt has to be seen first to give price   [place], [place]   [phone]     [place]   Dr. Ben Turner/Dr. Clay Burton  Exam, Cleaning, Xray $262   [address]  Extraction $207-$317   [place]   [phone]      [facility]  Cleaning $5   [address]  Xray $5   [place], [place] [address]  Call to get on waiting list   [phone]     Dr. James McMasters/Dr. Eric Sadler   [address]  Xray $85 Each   [place], [place] [address]  Extraction $200    Dr. Stacey Green  Extraction $300 per tooth   [address]   [place], [place] [address]   [phone]      Dr. Janna Civils  Cleaning $300   [address]  Extraction $273   , [place] [address]   [phone]     [place]   [facility]  Emergency Exam $65   [address]  Cleaning & Exam $150   [place],  [address]  Extractions: Simple $180 Surgical $250   [phone]  Fillings $150-$225     Safe Medications in Pregnancy   Acne: Benzoyl Peroxide Salicylic Acid  Backache/Headache: Tylenol: 2 regular strength every 4 hours OR              2 Extra strength every 6 hours  Colds/Coughs/Allergies: Benadryl (alcohol free) 25 mg every 6 hours as needed Breath right strips Claritin Cepacol throat lozenges Chloraseptic throat spray Cold-Eeze- up to three times per day Cough drops, alcohol free Flonase (by prescription only) Guaifenesin Mucinex Robitussin DM (plain only, alcohol free) Saline nasal spray/drops Sudafed (pseudoephedrine) & Actifed **  use only after [redacted] weeks gestation and if you do not have high blood pressure Tylenol Vicks Vaporub Zinc lozenges Zyrtec   Constipation: Colace Ducolax suppositories Fleet enema Glycerin suppositories Metamucil Milk of magnesia Miralax Senokot Smooth move tea  Diarrhea: Kaopectate Imodium A-D  *NO pepto Bismol  Hemorrhoids: Anusol Anusol HC Preparation H Tucks  Indigestion: Tums Maalox Mylanta Zantac  Pepcid  Insomnia: Benadryl (alcohol free) 25mg every 6 hours as needed Tylenol PM Unisom, no Gelcaps  Leg Cramps: Tums MagGel  Nausea/Vomiting:  Bonine Dramamine Emetrol Ginger extract Sea bands Meclizine  Nausea medication to take during pregnancy:  Unisom (doxylamine succinate 25 mg tablets) Take one tablet daily at bedtime. If symptoms are not adequately controlled, the dose can be increased to a maximum recommended dose of two tablets daily (1/2 tablet in the morning, 1/2 tablet mid-afternoon and one at bedtime). Vitamin B6 100mg tablets. Take one tablet twice a day (up to 200 mg per day).  Skin Rashes: Aveeno products Benadryl cream or 25mg every 6 hours as needed Calamine Lotion 1% cortisone cream  Yeast infection: Gyne-lotrimin 7   Monistat 7   **If taking multiple medications, please check labels to avoid duplicating the same active ingredients **take medication as directed on the label ** Do not exceed 4000 mg of tylenol in 24 hours **Do not take medications that contain aspirin or ibuprofen     

## 2021-06-05 NOTE — Telephone Encounter (Signed)
Patient called stating [facility] does not have any percocet in stock and will not until Monday. CNM called [facility] to cancel prescription and resent to [facility] per patient request.  Rolm Bookbinder, CNM 06/05/21

## 2021-06-05 NOTE — MAU Provider Note (Signed)
S Samantha Knapp is a 30 y.o. G1P0 patient who presents to MAU today with complaint of toothache. She was seen at the urgent care yesterday and given antibiotics but no pain medication. She rates the pain a 10/10. She denies any pain or vaginal bleeding.   She was also instructed to return today for repeat HCG. Per chart review, patient has an IUP and does not need repeat bloodwork today. She is already scheduled for a repeat ultrasound on 7/21.    O BP 123/78 (BP Location: Right Arm)   Pulse (!) 107   Temp 98.2 F (36.8 C) (Oral)   Resp 15   LMP 04/30/2021   SpO2 98%  Physical Exam Vitals and nursing note reviewed.  Constitutional:      General: She is not in acute distress.    Appearance: She is well-developed.  HENT:     Head: Normocephalic.     Mouth/Throat:     Dentition: Abnormal dentition. Dental tenderness and dental caries present.  Cardiovascular:     Rate and Rhythm: Normal rate.  Pulmonary:     Effort: Pulmonary effort is normal. No respiratory distress.  Abdominal:     General: There is no distension.     Palpations: Abdomen is soft.     Tenderness: There is no abdominal tenderness.  Skin:    General: Skin is warm and dry.  Neurological:     Mental Status: She is alert and oriented to person, place, and time.  Psychiatric:        Mood and Affect: Mood normal.        Behavior: Behavior normal.        Thought Content: Thought content normal.        Judgment: Judgment normal.   A Medical screening exam complete 1. Normal intrauterine pregnancy on prenatal ultrasound in first trimester   2. Pain, dental   3. [redacted] weeks gestation of pregnancy    Dose of pain medication and lidocaine given in MAU. Dental resources provided as well as dental letter  P Discharge from MAU in stable condition Patient given the option of transfer to [facility] for further evaluation or seek care in outpatient facility of choice  List of options for follow-up given  Warning  signs for worsening condition that would warrant emergency follow-up discussed Patient may return to MAU as needed   Rolm Bookbinder, [place] 06/05/2021 5:34 PM

## 2021-06-08 NOTE — Progress Notes (Signed)
Attestation of Attending Supervision of clinical support staff: I agree with the care provided to this patient and was available for any consultation.  I have reviewed the RN's note and chart. I was available for consult and to see the patient if needed. I reviewed the [place] at the point of care  Federico Flake, MD, MPH, ABFM Attending Physician Faculty Practice- Center for [facility]

## 2021-06-17 ENCOUNTER — Ambulatory Visit
Admission: RE | Admit: 2021-06-17 | Discharge: 2021-06-17 | Disposition: A | Discharge status: Home / Self Care | Payer: BC Managed Care – PPO | Source: Ambulatory Visit | Attending: Family Medicine | Admitting: Family Medicine

## 2021-06-17 ENCOUNTER — Ambulatory Visit: Payer: Self-pay

## 2021-06-17 ENCOUNTER — Other Ambulatory Visit: Payer: Self-pay

## 2021-06-17 DIAGNOSIS — Z3480 Encounter for supervision of other normal pregnancy, unspecified trimester: Secondary | ICD-10-CM

## 2021-06-17 DIAGNOSIS — O3680X0 Pregnancy with inconclusive fetal viability, not applicable or unspecified: Secondary | ICD-10-CM

## 2021-06-17 NOTE — Progress Notes (Signed)
Called pt with results. Pt verbalized understanding and will call for New OB appt with [facility].   Laney Pastor

## 2021-06-29 MED ORDER — ONDANSETRON 4 MG PO TBDP: 4.0000 mg | ORAL_TABLET | Freq: Three times a day (TID) | ORAL | 0 refills | Status: DC | PRN

## 2021-06-29 MED ORDER — PROMETHAZINE HCL 25 MG PO TABS: 12.5000 mg | ORAL_TABLET | Freq: Four times a day (QID) | ORAL | 1 refills | Status: DC | PRN

## 2021-07-29 ENCOUNTER — Other Ambulatory Visit: Payer: Self-pay | Admitting: Obstetrics & Gynecology

## 2021-07-29 DIAGNOSIS — Z3682 Encounter for antenatal screening for nuchal translucency: Secondary | ICD-10-CM

## 2021-07-30 ENCOUNTER — Ambulatory Visit (INDEPENDENT_AMBULATORY_CARE_PROVIDER_SITE_OTHER): Payer: BC Managed Care – PPO

## 2021-07-30 ENCOUNTER — Other Ambulatory Visit (HOSPITAL_COMMUNITY)
Admission: RE | Admit: 2021-07-30 | Discharge: 2021-07-30 | Disposition: A | Discharge status: Home / Self Care | Payer: BC Managed Care – PPO | Source: Ambulatory Visit | Attending: Women's Health | Admitting: Women's Health

## 2021-07-30 ENCOUNTER — Other Ambulatory Visit: Payer: Self-pay

## 2021-07-30 ENCOUNTER — Ambulatory Visit (INDEPENDENT_AMBULATORY_CARE_PROVIDER_SITE_OTHER): Payer: BC Managed Care – PPO | Admitting: Women's Health

## 2021-07-30 ENCOUNTER — Encounter: Payer: Self-pay | Admitting: Women's Health

## 2021-07-30 ENCOUNTER — Ambulatory Visit: Payer: BC Managed Care – PPO | Admitting: *Deleted

## 2021-07-30 VITALS — BP 103/69 | HR 84 | Wt 141.8 lb

## 2021-07-30 DIAGNOSIS — Z3A13 13 weeks gestation of pregnancy: Secondary | ICD-10-CM

## 2021-07-30 DIAGNOSIS — Z3401 Encounter for supervision of normal first pregnancy, first trimester: Secondary | ICD-10-CM | POA: Diagnosis not present

## 2021-07-30 DIAGNOSIS — Z3682 Encounter for antenatal screening for nuchal translucency: Secondary | ICD-10-CM

## 2021-07-30 DIAGNOSIS — Z3400 Encounter for supervision of normal first pregnancy, unspecified trimester: Secondary | ICD-10-CM | POA: Insufficient documentation

## 2021-07-30 LAB — POCT URINALYSIS DIPSTICK OB
Glucose, UA: NEGATIVE
Ketones, UA: NEGATIVE
Leukocytes, UA: NEGATIVE
Nitrite, UA: NEGATIVE
POC,PROTEIN,UA: NEGATIVE

## 2021-07-30 MED ORDER — BLOOD PRESSURE MONITOR MISC: 0 refills | Status: AC

## 2021-07-30 MED ORDER — BONJESTA 20-20 MG PO TBCR: 1.0000 | EXTENDED_RELEASE_TABLET | Freq: Every day | ORAL | 8 refills | Status: DC

## 2021-07-30 MED ORDER — ASPIRIN 81 MG PO TBEC: 81.0000 mg | DELAYED_RELEASE_TABLET | Freq: Every day | ORAL | 3 refills | Status: DC

## 2021-07-30 NOTE — Patient Instructions (Signed)
Shelby, thank you for choosing our office today! We appreciate the opportunity to meet your healthcare needs. You may receive a short survey by mail, e-mail, or through MyChart. If you are happy with your care we would appreciate if you could take just a few minutes to complete the survey questions. We read all of your comments and take your feedback very seriously. Thank you again for choosing our office.  [facility] ([address] , [place] [address]) Entrance C, located off of [address] Free 24/7 valet parking   Nausea & Vomiting Have saltine crackers or pretzels by your bed and eat a few bites before you raise your head out of bed in the morning Eat small frequent meals throughout the day instead of large meals Drink plenty of fluids throughout the day to stay hydrated, just don't drink a lot of fluids with your meals.  This can make your stomach fill up faster making you feel sick Do not brush your teeth right after you eat Products with real ginger are good for nausea, like ginger ale and ginger hard candy Make sure it says made with real ginger! Sucking on sour candy like lemon heads is also good for nausea If your prenatal vitamins make you nauseated, take them at night so you will sleep through the nausea Sea Bands If you feel like you need medicine for the nausea & vomiting please let us know If you are unable to keep any fluids or food down please let us know   Constipation Drink plenty of fluid, preferably water, throughout the day Eat foods high in fiber such as fruits, vegetables, and grains Exercise, such as walking, is a good way to keep your bowels regular Drink warm fluids, especially warm prune juice, or decaf coffee Eat a 1/2 cup of real oatmeal (not instant), 1/2 cup applesauce, and 1/2-1 cup warm prune juice every day If needed, you may take Colace (docusate sodium) stool  softener once or twice a day to help keep the stool soft.  If you still are having problems with constipation, you may take Miralax once daily as needed to help keep your bowels regular.   Home Blood Pressure Monitoring for Patients   Your provider has recommended that you check your blood pressure (BP) at least once a week at home. If you do not have a blood pressure cuff at home, one will be provided for you. Contact your provider if you have not received your monitor within 1 week.   Helpful Tips for Accurate Home Blood Pressure Checks  Don't smoke, exercise, or drink caffeine 30 minutes before checking your BP Use the restroom before checking your BP (a full bladder can raise your pressure) Relax in a comfortable upright chair Feet on the ground Left arm resting comfortably on a flat surface at the level of your heart Legs uncrossed Back supported Sit quietly and don't talk Place the cuff on your bare arm Adjust snuggly, so that only two fingertips can fit between your skin and the top of the cuff Check 2 readings separated by at least one minute Keep a log of your BP readings For a visual, please reference this diagram: http://ccnc.care/bpdiagram  Provider Name: [facility]     Phone: [phone]  Zone 1: ALL CLEAR  Continue to monitor your symptoms:  BP reading is less than 140 (top number) or less than 90 (bottom   number)  No right upper stomach pain No headaches or seeing spots No feeling nauseated or throwing up No swelling in face and hands  Zone 2: CAUTION Call your doctor's office for any of the following:  BP reading is greater than 140 (top number) or greater than 90 (bottom number)  Stomach pain under your ribs in the middle or right side Headaches or seeing spots Feeling nauseated or throwing up Swelling in face and hands  Zone 3: EMERGENCY  Seek immediate medical care if you have any of the following:  BP reading is greater than160 (top number) or  greater than 110 (bottom number) Severe headaches not improving with Tylenol Serious difficulty catching your breath Any worsening symptoms from Zone 2    First Trimester of Pregnancy The first trimester of pregnancy is from week 1 until the end of week 12 (months 1 through 3). A week after a sperm fertilizes an egg, the egg will implant on the wall of the uterus. This embryo will begin to develop into a baby. Genes from you and your partner are forming the baby. The female genes determine whether the baby is a boy or a girl. At 6-8 weeks, the eyes and face are formed, and the heartbeat can be seen on ultrasound. At the end of 12 weeks, all the baby's organs are formed.  Now that you are pregnant, you will want to do everything you can to have a healthy baby. Two of the most important things are to get good prenatal care and to follow your health care provider's instructions. Prenatal care is all the medical care you receive before the baby's birth. This care will help prevent, find, and treat any problems during the pregnancy and childbirth. BODY CHANGES Your body goes through many changes during pregnancy. The changes vary from woman to woman.  You may gain or lose a couple of pounds at first. You may feel sick to your stomach (nauseous) and throw up (vomit). If the vomiting is uncontrollable, call your health care provider. You may tire easily. You may develop headaches that can be relieved by medicines approved by your health care provider. You may urinate more often. Painful urination may mean you have a bladder infection. You may develop heartburn as a result of your pregnancy. You may develop constipation because certain hormones are causing the muscles that push waste through your intestines to slow down. You may develop hemorrhoids or swollen, bulging veins (varicose veins). Your breasts may begin to grow larger and become tender. Your nipples may stick out more, and the tissue that  surrounds them (areola) may become darker. Your gums may bleed and may be sensitive to brushing and flossing. Dark spots or blotches (chloasma, mask of pregnancy) may develop on your face. This will likely fade after the baby is born. Your menstrual periods will stop. You may have a loss of appetite. You may develop cravings for certain kinds of food. You may have changes in your emotions from day to day, such as being excited to be pregnant or being concerned that something may go wrong with the pregnancy and baby. You may have more vivid and strange dreams. You may have changes in your hair. These can include thickening of your hair, rapid growth, and changes in texture. Some women also have hair loss during or after pregnancy, or hair that feels dry or thin. Your hair will most likely return to normal after your baby is born. WHAT TO EXPECT AT YOUR PRENATAL  VISITS During a routine prenatal visit: You will be weighed to make sure you and the baby are growing normally. Your blood pressure will be taken. Your abdomen will be measured to track your baby's growth. The fetal heartbeat will be listened to starting around week 10 or 12 of your pregnancy. Test results from any previous visits will be discussed. Your health care provider may ask you: How you are feeling. If you are feeling the baby move. If you have had any abnormal symptoms, such as leaking fluid, bleeding, severe headaches, or abdominal cramping. If you have any questions. Other tests that may be performed during your first trimester include: Blood tests to find your blood type and to check for the presence of any previous infections. They will also be used to check for low iron levels (anemia) and Rh antibodies. Later in the pregnancy, blood tests for diabetes will be done along with other tests if problems develop. Urine tests to check for infections, diabetes, or protein in the urine. An ultrasound to confirm the proper growth  and development of the baby. An amniocentesis to check for possible genetic problems. Fetal screens for spina bifida and Down syndrome. You may need other tests to make sure you and the baby are doing well. HOME CARE INSTRUCTIONS  Medicines Follow your health care provider's instructions regarding medicine use. Specific medicines may be either safe or unsafe to take during pregnancy. Take your prenatal vitamins as directed. If you develop constipation, try taking a stool softener if your health care provider approves. Diet Eat regular, well-balanced meals. Choose a variety of foods, such as meat or vegetable-based protein, fish, milk and low-fat dairy products, vegetables, fruits, and whole grain breads and cereals. Your health care provider will help you determine the amount of weight gain that is right for you. Avoid raw meat and uncooked cheese. These carry germs that can cause birth defects in the baby. Eating four or five small meals rather than three large meals a day may help relieve nausea and vomiting. If you start to feel nauseous, eating a few soda crackers can be helpful. Drinking liquids between meals instead of during meals also seems to help nausea and vomiting. If you develop constipation, eat more high-fiber foods, such as fresh vegetables or fruit and whole grains. Drink enough fluids to keep your urine clear or pale yellow. Activity and Exercise Exercise only as directed by your health care provider. Exercising will help you: Control your weight. Stay in shape. Be prepared for labor and delivery. Experiencing pain or cramping in the lower abdomen or low back is a good sign that you should stop exercising. Check with your health care provider before continuing normal exercises. Try to avoid standing for long periods of time. Move your legs often if you must stand in one place for a long time. Avoid heavy lifting. Wear low-heeled shoes, and practice good posture. You may  continue to have sex unless your health care provider directs you otherwise. Relief of Pain or Discomfort Wear a good support bra for breast tenderness.   Take warm sitz baths to soothe any pain or discomfort caused by hemorrhoids. Use hemorrhoid cream if your health care provider approves.   Rest with your legs elevated if you have leg cramps or low back pain. If you develop varicose veins in your legs, wear support hose. Elevate your feet for 15 minutes, 3-4 times a day. Limit salt in your diet. Prenatal Care Schedule your prenatal visits by the  twelfth week of pregnancy. They are usually scheduled monthly at first, then more often in the last 2 months before delivery. Write down your questions. Take them to your prenatal visits. Keep all your prenatal visits as directed by your health care provider. Safety Wear your seat belt at all times when driving. Make a list of emergency phone numbers, including numbers for family, friends, the hospital, and police and fire departments. General Tips Ask your health care provider for a referral to a local prenatal education class. Begin classes no later than at the beginning of month 6 of your pregnancy. Ask for help if you have counseling or nutritional needs during pregnancy. Your health care provider can offer advice or refer you to specialists for help with various needs. Do not use hot tubs, steam rooms, or saunas. Do not douche or use tampons or scented sanitary pads. Do not cross your legs for long periods of time. Avoid cat litter boxes and soil used by cats. These carry germs that can cause birth defects in the baby and possibly loss of the fetus by miscarriage or stillbirth. Avoid all smoking, herbs, alcohol, and medicines not prescribed by your health care provider. Chemicals in these affect the formation and growth of the baby. Schedule a dentist appointment. At home, brush your teeth with a soft toothbrush and be gentle when you floss. SEEK  MEDICAL CARE IF:  You have dizziness. You have mild pelvic cramps, pelvic pressure, or nagging pain in the abdominal area. You have persistent nausea, vomiting, or diarrhea. You have a bad smelling vaginal discharge. You have pain with urination. You notice increased swelling in your face, hands, legs, or ankles. SEEK IMMEDIATE MEDICAL CARE IF:  You have a fever. You are leaking fluid from your vagina. You have spotting or bleeding from your vagina. You have severe abdominal cramping or pain. You have rapid weight gain or loss. You vomit blood or material that looks like coffee grounds. You are exposed to [place] measles and have never had them. You are exposed to fifth disease or chickenpox. You develop a severe headache. You have shortness of breath. You have any kind of trauma, such as from a fall or a car accident. Document Released: 11/08/2001 Document Revised: 03/31/2014 Document Reviewed: 09/24/2013 [facility] Patient Information 2015 [place], [place]. This information is not intended to replace advice given to you by your health care provider. Make sure you discuss any questions you have with your health care provider.

## 2021-07-30 NOTE — Progress Notes (Signed)
[place] 13 wks,measurements c/w dates,CRL 79.65 mm,NB present,NT 1.6 mm,normal ovaries,anterior placenta gr 0,fhr 155 bpm

## 2021-07-30 NOTE — Progress Notes (Signed)
INITIAL OBSTETRICAL VISIT Patient name: Samantha Knapp MRN [id number]  Date of birth: 04-11-1991 Chief Complaint:   Initial Prenatal Visit  History of Present Illness:   Samantha Knapp is a 30 y.o. G1P0 African-American female at [redacted]w[redacted]d by LMP c/w u/s at 7 weeks with an Estimated Date of Delivery: 02/04/22 being seen today for her initial obstetrical visit.   Patient's last menstrual period was 04/30/2021. Her obstetrical history is significant for primigravida.   Today she reports N/V-requests meds. Has zofran and phenergan at home. Went to [facility] earlier in pregnancy w/ spotting, none since.  Last pap 2-68yrs ago at [facility]. Results were:  neg  Depression screen [facility] 2/9 07/30/2021 08/05/2020  Decreased Interest 0 0  Down, Depressed, Hopeless 0 0  PHQ - 2 Score 0 0  Altered sleeping 0 -  Tired, decreased energy 1 -  Change in appetite 2 -  Feeling bad or failure about yourself  0 -  Trouble concentrating 0 -  Moving slowly or fidgety/restless 0 -  Suicidal thoughts 0 -  PHQ-9 Score 3 -     GAD 7 : Generalized Anxiety Score 07/30/2021  Nervous, Anxious, on Edge 0  Control/stop worrying 0  Worry too much - different things 0  Trouble relaxing 0  Restless 0  Easily annoyed or irritable 0  Afraid - awful might happen 0  Total GAD 7 Score 0     Review of Systems:   Pertinent items are noted in HPI Denies cramping/contractions, leakage of fluid, vaginal bleeding, abnormal vaginal discharge w/ itching/odor/irritation, headaches, visual changes, shortness of breath, chest pain, abdominal pain, severe nausea/vomiting, or problems with urination or bowel movements unless otherwise stated above.  Pertinent History Reviewed:  Reviewed past medical,surgical, social, obstetrical and family history.  Reviewed problem list, medications and allergies. OB History  Gravida Para Term Preterm AB Living  1            SAB IAB Ectopic Multiple Live Births               # Outcome Date  GA Lbr Len/2nd Weight Sex Delivery Anes PTL Lv  1 Current            Physical Assessment:   Vitals:   07/30/21 1210  BP: 103/69  Pulse: 84  Weight: 141 lb 12.8 oz (64.3 kg)  Body mass index is 23.6 kg/m.       Physical Examination:  General appearance - well appearing, and in no distress  Mental status - alert, oriented to person, place, and time  Psych:  She has a normal mood and affect  Skin - warm and dry, normal color, no suspicious lesions noted  Chest - effort normal, all lung fields clear to auscultation bilaterally  Heart - normal rate and regular rhythm  Abdomen - soft, nontender  Extremities:  No swelling or varicosities noted  Pelvic - VULVA: normal appearing vulva with no masses, tenderness or lesions  VAGINA: normal appearing vagina with normal color and discharge, no lesions  CERVIX: normal appearing cervix without discharge or lesions, no CMT  Thin prep pap is done w/ reflex HR HPV cotesting, mod amt brisk BRB- soaked up w/ fox swabs, and pressure to cx briefly. Bleeding completely stopped  Chaperone: Latisha Cresenzo    TODAY'S NT [place] 13 wks,measurements c/w dates,CRL 79.65 mm,NB present,NT 1.6 mm,normal ovaries,anterior placenta gr 0,fhr 155 bpm   Results for orders placed or performed in visit on 07/30/21 (from the past  24 hour(s))  POC Urinalysis Dipstick OB   Collection Time: 07/30/21 12:51 PM  Result Value Ref Range   Color, UA     Clarity, UA     Glucose, UA Negative Negative   Bilirubin, UA     Ketones, UA neg    Spec Grav, UA     Blood, UA trace    pH, UA     POC,PROTEIN,UA Negative Negative, Trace, Small (1+), Moderate (2+), Large (3+), 4+   Urobilinogen, UA     Nitrite, UA neg    Leukocytes, UA Negative Negative   Appearance     Odor      Assessment & Plan:  1) Low-Risk Pregnancy G1P0 at [redacted]w[redacted]d with an Estimated Date of Delivery: 02/04/22   2) Initial OB visit  3) Friable cx  4) N/V> rx Bonjesta  Meds:  Meds ordered this encounter   Medications   Blood Pressure Monitor MISC    Sig: For regular home bp monitoring during pregnancy    Dispense:  1 each    Refill:  0    Please mail to patient   Doxylamine-Pyridoxine ER (BONJESTA) 20-20 MG TBCR    Sig: Take 1 tablet by mouth at bedtime. Can add 1 tablet in the morning if needed for nausea and vomiting    Dispense:  60 tablet    Refill:  8    Order Specific Question:   Supervising Provider    Answer:   Duane Lope H [[id number]]   aspirin 81 MG EC tablet    Sig: Take 1 tablet (81 mg total) by mouth daily. Swallow whole.    Dispense:  90 tablet    Refill:  3    Order Specific Question:   Supervising Provider    Answer:   Duane Lope H [[id number]]    Initial labs obtained Continue prenatal vitamins Reviewed n/v relief measures and warning s/s to report Reviewed recommended weight gain based on pre-gravid BMI Encouraged well-balanced diet Genetic & carrier screening discussed: requests NT/IT, undecided about [employer] and [employer] , wants to check w/ insurance Ultrasound discussed; fetal survey: requested [facility]NC completed> form faxed if has or is planning to apply for medicaid The nature of [employer] for [employer] with multiple MDs and other Advanced Practice Providers was explained to patient; also emphasized that fellows, residents, and students are part of our team. Does not have home bp cuff. Office bp cuff given: yes. Rx sent: no. Check bp weekly, let us know if consistently >140/90.   Indications for ASA therapy (per uptodate) OR Two or more of the following: Nulliparity Yes Sociodemographic characteristics (African American race, low socioeconomic level) Yes  Follow-up: Return in about 3 weeks (around 08/20/2021) for [facility], 2nd IT, CNM, in person.   Orders Placed This Encounter  Procedures   Urine Culture   Pain Management Screening Profile (10S)   Integrated 1   CBC/D/Plt+RPR+Rh+ABO+RubIgG...   POC Urinalysis Dipstick OB    Cheral Marker  CNM, [facility] 07/30/2021 1:14 PM

## 2021-07-31 LAB — CBC/D/PLT+RPR+RH+ABO+RUBIGG...
Antibody Screen: NEGATIVE
Basophils Absolute: 0 10*3/uL (ref 0.0–0.2)
Basos: 0 %
EOS (ABSOLUTE): 0.1 10*3/uL (ref 0.0–0.4)
Eos: 1 %
HCV Ab: <0.1 s/co ratio (ref 0.0–0.9)
HIV Screen 4th Generation wRfx: NONREACTIVE
Hematocrit: 35.8 % (ref 34.0–46.6)
Hemoglobin: 12.3 g/dL (ref 11.1–15.9)
Hepatitis B Surface Ag: NEGATIVE
Immature Grans (Abs): 0.1 10*3/uL (ref 0.0–0.1)
Immature Granulocytes: 1 %
Lymphocytes Absolute: 2.1 10*3/uL (ref 0.7–3.1)
Lymphs: 19 %
MCH: 28 pg (ref 26.6–33.0)
MCHC: 34.4 g/dL (ref 31.5–35.7)
MCV: 82 fL (ref 79–97)
Monocytes Absolute: 0.9 10*3/uL (ref 0.1–0.9)
Monocytes: 8 %
Neutrophils Absolute: 7.8 10*3/uL — ABNORMAL HIGH (ref 1.4–7.0)
Neutrophils: 71 %
Platelets: 289 10*3/uL (ref 150–450)
RBC: 4.39 x10E6/uL (ref 3.77–5.28)
RDW: 13.3 % (ref 11.7–15.4)
RPR Ser Ql: NONREACTIVE
Rh Factor: POSITIVE
Rubella Antibodies, IGG: 2.62 index (ref >0.99)
WBC: 11 10*3/uL — ABNORMAL HIGH (ref 3.4–10.8)

## 2021-07-31 LAB — PMP SCREEN PROFILE (10S), URINE
Amphetamine Scrn, Ur: NEGATIVE ng/mL
BARBITURATE SCREEN URINE: NEGATIVE ng/mL
BENZODIAZEPINE SCREEN, URINE: NEGATIVE ng/mL
CANNABINOIDS UR QL SCN: NEGATIVE ng/mL
Cocaine (Metab) Scrn, Ur: NEGATIVE ng/mL
Creatinine(Crt), U: 141.8 mg/dL (ref 20.0–300.0)
Methadone Screen, Urine: NEGATIVE ng/mL
OXYCODONE+OXYMORPHONE UR QL SCN: NEGATIVE ng/mL
Opiate Scrn, Ur: NEGATIVE ng/mL
Ph of Urine: 8.2 (ref 4.5–8.9)
Phencyclidine Qn, Ur: NEGATIVE ng/mL
Propoxyphene Scrn, Ur: NEGATIVE ng/mL

## 2021-07-31 LAB — HCV INTERPRETATION

## 2021-08-03 ENCOUNTER — Telehealth: Payer: Self-pay | Admitting: Women's Health

## 2021-08-03 NOTE — Telephone Encounter (Signed)
Patient stated the medicine Bonjesta she was prescribed for nausea wasn't covered by her insurance.

## 2021-08-04 ENCOUNTER — Other Ambulatory Visit: Payer: Self-pay | Admitting: Advanced Practice Midwife

## 2021-08-04 LAB — INTEGRATED 1
Crown Rump Length: 79.7 mm
Gest. Age on Collection Date: 13.7 weeks
Maternal Age at EDD: 28.7 yr
Nuchal Translucency (NT): 1.6 mm
Number of Fetuses: 1
PAPP-A Value: 1542.4 ng/mL
Weight: 142 [lb_av]

## 2021-08-04 LAB — URINE CULTURE

## 2021-08-04 MED ORDER — DOXYLAMINE-PYRIDOXINE 10-10 MG PO TBEC: DELAYED_RELEASE_TABLET | ORAL | 3 refills | Status: DC

## 2021-08-05 ENCOUNTER — Telehealth: Payer: Self-pay | Admitting: Advanced Practice Midwife

## 2021-08-05 NOTE — Telephone Encounter (Signed)
Semaya wanted to know if it was okay if she took Mucinex X Strength 1200mg . She stated she will be 14 weeks tomorrow.

## 2021-08-12 ENCOUNTER — Encounter: Payer: Self-pay | Admitting: Women's Health

## 2021-08-12 DIAGNOSIS — R87810 Cervical high risk human papillomavirus (HPV) DNA test positive: Secondary | ICD-10-CM | POA: Insufficient documentation

## 2021-08-12 LAB — CYTOLOGY - PAP
Chlamydia: NEGATIVE
Comment: NEGATIVE
Comment: NEGATIVE
Comment: NEGATIVE
Comment: NORMAL
Diagnosis: NEGATIVE
Diagnosis: REACTIVE
HPV 16: NEGATIVE
HPV 18 / 45: NEGATIVE
High risk HPV: POSITIVE — AB
Neisseria Gonorrhea: NEGATIVE

## 2021-08-13 ENCOUNTER — Telehealth: Payer: Self-pay

## 2021-08-13 NOTE — Telephone Encounter (Signed)
Pt had no read Mychart msg. Called pt per Joellyn Haff to relay test result and need for release signature. Two identifiers used. Pt confirmed understanding.

## 2021-08-20 ENCOUNTER — Encounter: Payer: BC Managed Care – PPO | Admitting: Family Medicine

## 2021-08-23 ENCOUNTER — Telehealth: Payer: Self-pay | Admitting: Women's Health

## 2021-08-23 NOTE — Telephone Encounter (Signed)
Patient wants somebody to call her about her lab results.

## 2021-08-23 NOTE — Telephone Encounter (Signed)
Returned pt's call for clarification. Two identifiers used. Pt wanted to know if the office had received the paperwork from [facility] that she had signed a release for last week. Told pt I would check and if we needed anything from her, I would call her back tomorrow. Pt confirmed understanding.

## 2021-08-26 ENCOUNTER — Other Ambulatory Visit: Payer: Self-pay

## 2021-08-26 ENCOUNTER — Encounter (HOSPITAL_COMMUNITY): Payer: Self-pay | Admitting: Obstetrics and Gynecology

## 2021-08-26 ENCOUNTER — Inpatient Hospital Stay (HOSPITAL_COMMUNITY)
Admission: AD | Admit: 2021-08-26 | Discharge: 2021-08-26 | Disposition: A | Discharge status: Home / Self Care | Payer: BLUE CROSS/BLUE SHIELD | Attending: Obstetrics and Gynecology | Admitting: Obstetrics and Gynecology

## 2021-08-26 DIAGNOSIS — R1032 Left lower quadrant pain: Secondary | ICD-10-CM | POA: Insufficient documentation

## 2021-08-26 DIAGNOSIS — Z87440 Personal history of urinary (tract) infections: Secondary | ICD-10-CM | POA: Insufficient documentation

## 2021-08-26 DIAGNOSIS — R109 Unspecified abdominal pain: Secondary | ICD-10-CM | POA: Diagnosis not present

## 2021-08-26 DIAGNOSIS — R3129 Other microscopic hematuria: Secondary | ICD-10-CM | POA: Insufficient documentation

## 2021-08-26 DIAGNOSIS — O99891 Other specified diseases and conditions complicating pregnancy: Secondary | ICD-10-CM | POA: Diagnosis not present

## 2021-08-26 DIAGNOSIS — Z3A16 16 weeks gestation of pregnancy: Secondary | ICD-10-CM | POA: Insufficient documentation

## 2021-08-26 DIAGNOSIS — Z7982 Long term (current) use of aspirin: Secondary | ICD-10-CM | POA: Insufficient documentation

## 2021-08-26 DIAGNOSIS — O26892 Other specified pregnancy related conditions, second trimester: Secondary | ICD-10-CM | POA: Insufficient documentation

## 2021-08-26 LAB — URINALYSIS, ROUTINE W REFLEX MICROSCOPIC
Bilirubin Urine: NEGATIVE
Glucose, UA: 50 mg/dL — AB
Ketones, ur: NEGATIVE mg/dL
Leukocytes,Ua: NEGATIVE
Nitrite: NEGATIVE
Protein, ur: NEGATIVE mg/dL
Specific Gravity, Urine: 1.023 (ref 1.005–1.030)
pH: 7 (ref 5.0–8.0)

## 2021-08-26 LAB — WET PREP, GENITAL
Clue Cells Wet Prep HPF POC: NONE SEEN
Sperm: NONE SEEN
Trich, Wet Prep: NONE SEEN
Yeast Wet Prep HPF POC: NONE SEEN

## 2021-08-26 MED ORDER — CEFADROXIL 500 MG PO CAPS: 500.0000 mg | ORAL_CAPSULE | Freq: Two times a day (BID) | ORAL | 0 refills | Status: DC

## 2021-08-26 MED ORDER — ACETAMINOPHEN 500 MG PO TABS
1000.0000 mg | ORAL_TABLET | Freq: Once | ORAL | Status: AC
  Administered 2021-08-26: 1000 mg via ORAL
  Filled 2021-08-26: qty 2

## 2021-08-26 NOTE — MAU Note (Signed)
Having crampy pain on L side all day. "Down where my round ligament is now". Just wanted to be sure everything is ok. Denies VB and having normal vag d/c. I've been crying since 1730 with the pain. No pain meds taken. Was at mom's house and she did not have any meds

## 2021-08-26 NOTE — MAU Note (Signed)
Pt c/o left lower cramping since 830am today, 8/10 scale, pt denies vaginal, no leakage of fluid, no c/o headache, dizziness blurry vision or right upper quadrant pain, support person at bedside, safety maintained.

## 2021-08-26 NOTE — MAU Provider Note (Addendum)
History     CSN: [id number]  Arrival date and time: 08/26/21 1914   Event Date/Time   First Provider Initiated Contact with Patient 08/26/21 2006      Chief Complaint  Patient presents with   Abdominal Pain   HPI  #Left groin pain - started today - worse when moving - currently better while sitting still - hasn't tried anything for pain - no dysuria - no pyuria - discharge pretty much same as before - has some nausea but not different than her usual    OB History     Gravida  1   Para      Term      Preterm      AB      Living         SAB      IAB      Ectopic      Multiple      Live Births              Past Medical History:  Diagnosis Date   DDD (degenerative disc disease), lumbar    Frequent UTI    Migraines    Has improved.    Past Surgical History:  Procedure Laterality Date   NO PAST SURGERIES     TYMPANOSTOMY TUBE PLACEMENT      Family History  Problem Relation Age of Onset   Diabetes Mother    Hyperlipidemia Mother    Scoliosis Brother    Headache Brother    Diabetes Maternal Grandmother    Lung cancer Maternal Grandmother    Diabetes Maternal Grandfather    Esophageal cancer Maternal Grandfather    Cancer Paternal Grandmother    Breast cancer Paternal Grandmother    Lymphoma Paternal Grandfather     Social History   Tobacco Use   Smoking status: Never   Smokeless tobacco: Never  Vaping Use   Vaping Use: Never used  Substance Use Topics   Alcohol use: Not Currently    Comment: occ   Drug use: Not Currently    Allergies: No Known Allergies  Medications Prior to Admission  Medication Sig Dispense Refill Last Dose   aspirin 81 MG EC tablet Take 1 tablet (81 mg total) by mouth daily. Swallow whole. 90 tablet 3 08/25/2021   Blood Pressure Monitor MISC For regular home bp monitoring during pregnancy 1 each 0 Past Week   Doxylamine-Pyridoxine 10-10 MG TBEC Take 2 po q hs and may take 1 in the morning and then 1  in the afternoon 120 tablet 3    ondansetron (ZOFRAN ODT) 4 MG disintegrating tablet Take 1 tablet (4 mg total) by mouth every 8 (eight) hours as needed for nausea or vomiting. 30 tablet 0    oxyCODONE-acetaminophen (PERCOCET/ROXICET) 5-325 MG tablet Take 1 tablet by mouth every 6 (six) hours as needed for severe pain. 20 tablet 0    promethazine (PHENERGAN) 25 MG tablet Take 0.5 tablets (12.5 mg total) by mouth every 6 (six) hours as needed for nausea or vomiting. 30 tablet 1     Review of Systems  Constitutional:  Negative for fever.  Respiratory:  Negative for shortness of breath.   Cardiovascular:  Negative for chest pain.  Gastrointestinal:  Negative for abdominal pain, constipation and vomiting.  Genitourinary:  Negative for dysuria.  Musculoskeletal:  Negative for myalgias.  Allergic/Immunologic: Negative for immunocompromised state.  Neurological:  Negative for dizziness and headaches.  Hematological:  Negative for adenopathy.  Physical Exam  Blood pressure 116/60, pulse 82, temperature 98.2 F (36.8 C), temperature source Oral, resp. rate 18, height 5\' 5"  (1.651 m), weight 67.6 kg, last menstrual period 04/30/2021, SpO2 100 %.  Physical Exam Vitals reviewed.  Constitutional:      Appearance: She is well-developed.  Cardiovascular:     Rate and Rhythm: Normal rate.  Abdominal:     Palpations: Abdomen is soft. There is no mass.     Hernia: No hernia is present.     Comments: No abdominal TTP, TTP of left groin crease  Skin:    General: Skin is warm.     Capillary Refill: Capillary refill takes less than 2 seconds.  Neurological:     General: No focal deficit present.     Mental Status: She is alert.     MAU Course    MDM 30 yo F G1P0 at [redacted]w[redacted]d who presents with 1 day of non-radiating left sided groin pain without evidence of urinary or vaginal discharge, most likely round ligament pain.  Assessment and Plan   Left groin pain 30 yo F G1P0 at [redacted]w[redacted]d who presents  with 1 day of non-radiating left sided groin pain that she has not taken any medications for. She denies any upper abdominal pain, vaginal bleeding, and dysuria. Patient does have history of frequent UTIs. Reports discharge but not much different than previous. VSS stable. On exam pain TTP of left groin crease. Most likely round ligament pain however will also check urine and swabs given above hx. Heart tones appropriate on doppler (140s) - Tylenol 1000mg   - Urine analysis and urine culture - GC/CT swab and wet prep  8:40 PM UA with squamous cells and some bacteria. Likely not clean catch. Given no urinary symptoms and groin pain more likely to be attributed to round ligament pain will not repeat UA. Will send urine for culture.   [redacted]w[redacted]d 08/26/2021, 8:07 PM   Results for orders placed or performed during the hospital encounter of 08/26/21 (from the past 24 hour(s))  Urinalysis, Routine w reflex microscopic Urine, Clean Catch     Status: Abnormal   Collection Time: 08/26/21  7:50 PM  Result Value Ref Range   Color, Urine YELLOW YELLOW   APPearance HAZY (A) CLEAR   Specific Gravity, Urine 1.023 1.005 - 1.030   pH 7.0 5.0 - 8.0   Glucose, UA 50 (A) NEGATIVE mg/dL   Hgb urine dipstick SMALL (A) NEGATIVE   Bilirubin Urine NEGATIVE NEGATIVE   Ketones, ur NEGATIVE NEGATIVE mg/dL   Protein, ur NEGATIVE NEGATIVE mg/dL   Nitrite NEGATIVE NEGATIVE   Leukocytes,Ua NEGATIVE NEGATIVE   RBC / HPF 6-10 0 - 5 RBC/hpf   WBC, UA 0-5 0 - 5 WBC/hpf   Bacteria, UA RARE (A) NONE SEEN   Squamous Epithelial / LPF 21-50 0 - 5   Mucus PRESENT   Wet prep, genital     Status: Abnormal   Collection Time: 08/26/21  8:25 PM  Result Value Ref Range   Yeast Wet Prep HPF POC NONE SEEN NONE SEEN   Trich, Wet Prep NONE SEEN NONE SEEN   Clue Cells Wet Prep HPF POC NONE SEEN NONE SEEN   WBC, Wet Prep HPF POC MANY (A) NONE SEEN   Sperm NONE SEEN    Discussed hematuria and rare bacteria may indicate UTI Rx sent  for Duricef.  Discussed she may wait to take it until culture comes back Will discharge home Followup at [facility] prn Encouraged to  return if she develops worsening of symptoms, increase in pain, fever, or other concerning symptoms.   Aviva Signs, CNM

## 2021-08-26 NOTE — Discharge Instructions (Signed)
You came to the MAU because you had pain of your left side/groin. We think this is round ligament pain and we gave you Tylenol. We also checked your urine and did vaginal swabs for any infections.   Please seek medical care if you have crampy pain that is coming and going, vaginal bleeding, leaking of large amount of watery fluid.

## 2021-08-27 LAB — GC/CHLAMYDIA PROBE AMP (~~LOC~~) NOT AT ARMC
Chlamydia: NEGATIVE
Comment: NEGATIVE
Comment: NORMAL
Neisseria Gonorrhea: NEGATIVE

## 2021-08-28 LAB — CULTURE, OB URINE: Culture: <10000 — AB

## 2021-09-01 ENCOUNTER — Other Ambulatory Visit: Payer: Self-pay

## 2021-09-01 ENCOUNTER — Ambulatory Visit (INDEPENDENT_AMBULATORY_CARE_PROVIDER_SITE_OTHER): Payer: BLUE CROSS/BLUE SHIELD | Admitting: Advanced Practice Midwife

## 2021-09-01 VITALS — BP 120/71 | HR 91 | Wt 149.6 lb

## 2021-09-01 DIAGNOSIS — Z363 Encounter for antenatal screening for malformations: Secondary | ICD-10-CM

## 2021-09-01 DIAGNOSIS — Z3A17 17 weeks gestation of pregnancy: Secondary | ICD-10-CM

## 2021-09-01 DIAGNOSIS — Z1379 Encounter for other screening for genetic and chromosomal anomalies: Secondary | ICD-10-CM | POA: Diagnosis not present

## 2021-09-01 DIAGNOSIS — Z3402 Encounter for supervision of normal first pregnancy, second trimester: Secondary | ICD-10-CM

## 2021-09-01 NOTE — Progress Notes (Signed)
   LOW-RISK PREGNANCY VISIT Patient name: Samantha Knapp MRN [id number]  Date of birth: Aug 15, 1991 Chief Complaint:   Routine Prenatal Visit  History of Present Illness:   Samantha Knapp is a 30 y.o. G1P0 female at [redacted]w[redacted]d with an Estimated Date of Delivery: 02/04/22 being seen today for ongoing management of a low-risk pregnancy.  Today she reports  having recent MAU visit for abd pain that is getting better once she began passing gas; has stopped taking bASA because of her concern over fetal heartrate being lower than it was in the past, plus she had a friend who feels like her preg was in danger due to baby aspirin . Contractions: Not present. Vag. Bleeding: None.  Movement: Present. denies leaking of fluid. Review of Systems:   Pertinent items are noted in HPI Denies abnormal vaginal discharge w/ itching/odor/irritation, headaches, visual changes, shortness of breath, chest pain, abdominal pain, severe nausea/vomiting, or problems with urination or bowel movements unless otherwise stated above. Pertinent History Reviewed:  Reviewed past medical,surgical, social, obstetrical and family history.  Reviewed problem list, medications and allergies. Physical Assessment:   Vitals:   09/01/21 1521  BP: 120/71  Pulse: 91  Weight: 149 lb 9.6 oz (67.9 kg)  Body mass index is 24.89 kg/m.        Physical Examination:   General appearance: Well appearing, and in no distress  Mental status: Alert, oriented to person, place, and time  Skin: Warm & dry  Cardiovascular: Normal heart rate noted  Respiratory: Normal respiratory effort, no distress  Abdomen: Soft, gravid, nontender  Pelvic: Cervical exam deferred         Extremities: Edema: None  Fetal Status: Fetal Heart Rate (bpm): 148   Movement: Present    No results found for this or any previous visit (from the past 24 hour(s)).  Assessment & Plan:  1) Low-risk pregnancy G1P0 at [redacted]w[redacted]d with an Estimated Date of Delivery:  02/04/22   2) Recent abd pain/MAU visit 9/29, reviewed that urine culture was negative and she can stop the abx; is feeling better now  3) Concern re fetal heartrate, reviewed the nl variations; discussed the purpose of baby aspirin and that it has no effect on FHR; she has elected to stop taking it   Meds: No orders of the defined types were placed in this encounter.  Labs/procedures today: 2nd IT  Plan:  Continue routine obstetrical care   Reviewed: Preterm labor symptoms and general obstetric precautions including but not limited to vaginal bleeding, contractions, leaking of fluid and fetal movement were reviewed in detail with the patient.  All questions were answered. Has home bp cuff. Check bp weekly, let us know if >140/90.   Follow-up: Return for 2-3wk anatomy u/s and LROB.  Orders Placed This Encounter  Procedures   US OB Comp + 14 Wk   INTEGRATED 2   Samantha Knapp [facility] 09/01/2021 3:56 PM

## 2021-09-03 LAB — INTEGRATED 2
AFP MoM: 0.98
Alpha-Fetoprotein: 50.9 ng/mL
Crown Rump Length: 79.7 mm
DIA MoM: 0.82
DIA Value: 136.7 pg/mL
Estriol, Unconjugated: 1.68 ng/mL
Gest. Age on Collection Date: 13.7 weeks
Gestational Age: 18.4 weeks
Maternal Age at EDD: 28.7 yr
Nuchal Translucency (NT): 1.6 mm
Nuchal Translucency MoM: 0.85
Number of Fetuses: 1
PAPP-A MoM: 0.99
PAPP-A Value: 1542.4 ng/mL
Test Results:: NEGATIVE
Weight: 142 [lb_av]
Weight: 142 [lb_av]
hCG MoM: 0.57
hCG Value: 15.5 IU/mL
uE3 MoM: 1.03

## 2021-09-29 ENCOUNTER — Ambulatory Visit (INDEPENDENT_AMBULATORY_CARE_PROVIDER_SITE_OTHER): Payer: BLUE CROSS/BLUE SHIELD | Admitting: Advanced Practice Midwife

## 2021-09-29 ENCOUNTER — Other Ambulatory Visit: Payer: Self-pay

## 2021-09-29 ENCOUNTER — Ambulatory Visit (INDEPENDENT_AMBULATORY_CARE_PROVIDER_SITE_OTHER): Payer: BLUE CROSS/BLUE SHIELD

## 2021-09-29 VITALS — BP 123/73 | HR 91 | Wt 153.0 lb

## 2021-09-29 DIAGNOSIS — Z3402 Encounter for supervision of normal first pregnancy, second trimester: Secondary | ICD-10-CM

## 2021-09-29 DIAGNOSIS — Z3A21 21 weeks gestation of pregnancy: Secondary | ICD-10-CM

## 2021-09-29 DIAGNOSIS — Z363 Encounter for antenatal screening for malformations: Secondary | ICD-10-CM

## 2021-09-29 NOTE — Patient Instructions (Signed)
Samantha Knapp, I greatly value your feedback.  If you receive a survey following your visit with [place] today, we appreciate you taking the time to fill it out.  Thanks, Philipp Deputy, CNM   You will have your sugar test next visit.  Please do not eat or drink anything after midnight the night before you come, not even water.  You will be here for at least two hours.  Please make an appointment online for the bloodwork at [email or web address] for 8:30am (or as close to this as possible). Make sure you select the [facility] service center. The day of the appointment, check in with our office first, then you will go to [facility] to start the sugar test.    [facility] HAS MOVED!!! It is now [facility] at [facility] ([address] [place], [place] [address]) Entrance C, located off of [address] valet parking  Go to [email or web address] to register for FREE online childbirth classes   Call the office [phone]) or go to [facility] if: You begin to have strong, frequent contractions Your water breaks.  Sometimes it is a big gush of fluid, sometimes it is just a trickle that keeps getting your panties wet or running down your legs You have vaginal bleeding.  It is normal to have a small amount of spotting if your cervix was checked.  You don't feel your baby moving like normal.  If you don't, get you something to eat and drink and lay down and focus on feeling your baby move.   If your baby is still not moving like normal, you should call the office or go to [facility].  [place] Pediatricians/Family Doctors: [facility] [phone]           [facility] [phone]                [facility] [phone] (usually not accepting new patients unless you have family there already, you are always welcome to call and ask)      [facility] Department [phone]       [facility] Pediatricians/Family Doctors:  [facility]: [phone] [facility]: [phone] [facility]: [phone]  [facility] Doctors:  [facility]: [phone]  [facility]: [phone]  [facility] Doctors: [facility]: [phone]   Home Blood Pressure Monitoring for Patients   Your provider has recommended that you check your blood pressure (BP) at least once a week at home. If you do not have a blood pressure cuff at home, one will be provided for you. Contact your provider if you have not received your monitor within 1 week.   Helpful Tips for Accurate Home Blood Pressure Checks  Don't smoke, exercise, or drink caffeine 30 minutes before checking your BP Use the restroom before checking your BP (a full bladder can raise your pressure) Relax in a comfortable upright chair Feet on the ground Left arm resting comfortably on a flat surface at the level of your heart Legs uncrossed Back supported Sit quietly and don't talk Place the cuff on your bare arm Adjust snuggly, so that only two fingertips can fit between your skin and the top of the cuff Check 2 readings separated by at least one minute Keep a log of your BP readings For a visual, please reference this diagram: http://ccnc.care/bpdiagram  Provider Name: [facility]     Phone: [phone]  Zone 1: ALL CLEAR  Continue to monitor your symptoms:  BP reading is less than 140 (top number) or less than 90 (bottom number)  No right upper stomach pain No headaches or seeing spots No feeling nauseated or throwing up No swelling in face and hands  Zone 2: CAUTION Call your doctor's office for any of the following:  BP reading is greater than 140 (top number) or greater than 90 (bottom number)  Stomach pain under your ribs in the middle or right side Headaches or seeing spots Feeling nauseated or throwing up Swelling in face and hands  Zone 3: EMERGENCY   Seek immediate medical care if you have any of the following:  BP reading is greater than160 (top number) or greater than 110 (bottom number) Severe headaches not improving with Tylenol Serious difficulty catching your breath Any worsening symptoms from Zone 2   Second Trimester of Pregnancy The second trimester is from week 13 through week 28, months 4 through 6. The second trimester is often a time when you feel your best. Your body has also adjusted to being pregnant, and you begin to feel better physically. Usually, morning sickness has lessened or quit completely, you may have more energy, and you may have an increase in appetite. The second trimester is also a time when the fetus is growing rapidly. At the end of the sixth month, the fetus is about 9 inches long and weighs about 1 pounds. You will likely begin to feel the baby move (quickening) between 18 and 20 weeks of the pregnancy. BODY CHANGES Your body goes through many changes during pregnancy. The changes vary from woman to woman.  Your weight will continue to increase. You will notice your lower abdomen bulging out. You may begin to get stretch marks on your hips, abdomen, and breasts. You may develop headaches that can be relieved by medicines approved by your health care provider. You may urinate more often because the fetus is pressing on your bladder. You may develop or continue to have heartburn as a result of your pregnancy. You may develop constipation because certain hormones are causing the muscles that push waste through your intestines to slow down. You may develop hemorrhoids or swollen, bulging veins (varicose veins). You may have back pain because of the weight gain and pregnancy hormones relaxing your joints between the bones in your pelvis and as a result of a shift in weight and the muscles that support your balance. Your breasts will continue to grow and be tender. Your gums may bleed and may be sensitive to  brushing and flossing. Dark spots or blotches (chloasma, mask of pregnancy) may develop on your face. This will likely fade after the baby is born. A dark line from your belly button to the pubic area (linea nigra) may appear. This will likely fade after the baby is born. You may have changes in your hair. These can include thickening of your hair, rapid growth, and changes in texture. Some women also have hair loss during or after pregnancy, or hair that feels dry or thin. Your hair will most likely return to normal after your baby is born. WHAT TO EXPECT AT YOUR PRENATAL VISITS During a routine prenatal visit: You will be weighed to make sure you and the fetus are growing normally. Your blood pressure will be taken. Your abdomen will be measured to track your baby's growth. The fetal heartbeat will be listened to. Any test results from the previous visit will be discussed. Your health care provider  may ask you: How you are feeling. If you are feeling the baby move. If you have had any abnormal symptoms, such as leaking fluid, bleeding, severe headaches, or abdominal cramping. If you have any questions. Other tests that may be performed during your second trimester include: Blood tests that check for: Low iron levels (anemia). Gestational diabetes (between 24 and 28 weeks). Rh antibodies. Urine tests to check for infections, diabetes, or protein in the urine. An ultrasound to confirm the proper growth and development of the baby. An amniocentesis to check for possible genetic problems. Fetal screens for spina bifida and Down syndrome. HOME CARE INSTRUCTIONS  Avoid all smoking, herbs, alcohol, and unprescribed drugs. These chemicals affect the formation and growth of the baby. Follow your health care provider's instructions regarding medicine use. There are medicines that are either safe or unsafe to take during pregnancy. Exercise only as directed by your health care provider.  Experiencing uterine cramps is a good sign to stop exercising. Continue to eat regular, healthy meals. Wear a good support bra for breast tenderness. Do not use hot tubs, steam rooms, or saunas. Wear your seat belt at all times when driving. Avoid raw meat, uncooked cheese, cat litter boxes, and soil used by cats. These carry germs that can cause birth defects in the baby. Take your prenatal vitamins. Try taking a stool softener (if your health care provider approves) if you develop constipation. Eat more high-fiber foods, such as fresh vegetables or fruit and whole grains. Drink plenty of fluids to keep your urine clear or pale yellow. Take warm sitz baths to soothe any pain or discomfort caused by hemorrhoids. Use hemorrhoid cream if your health care provider approves. If you develop varicose veins, wear support hose. Elevate your feet for 15 minutes, 3-4 times a day. Limit salt in your diet. Avoid heavy lifting, wear low heel shoes, and practice good posture. Rest with your legs elevated if you have leg cramps or low back pain. Visit your dentist if you have not gone yet during your pregnancy. Use a soft toothbrush to brush your teeth and be gentle when you floss. A sexual relationship may be continued unless your health care provider directs you otherwise. Continue to go to all your prenatal visits as directed by your health care provider. SEEK MEDICAL CARE IF:  You have dizziness. You have mild pelvic cramps, pelvic pressure, or nagging pain in the abdominal area. You have persistent nausea, vomiting, or diarrhea. You have a bad smelling vaginal discharge. You have pain with urination. SEEK IMMEDIATE MEDICAL CARE IF:  You have a fever. You are leaking fluid from your vagina. You have spotting or bleeding from your vagina. You have severe abdominal cramping or pain. You have rapid weight gain or loss. You have shortness of breath with chest pain. You notice sudden or extreme swelling  of your face, hands, ankles, feet, or legs. You have not felt your baby move in over an hour. You have severe headaches that do not go away with medicine. You have vision changes. Document Released: 11/08/2001 Document Revised: 11/19/2013 Document Reviewed: 01/15/2013 [facility] Patient Information 2015 [place], [place]. This information is not intended to replace advice given to you by your health care provider. Make sure you discuss any questions you have with your health care provider.

## 2021-09-29 NOTE — Progress Notes (Signed)
   LOW-RISK PREGNANCY VISIT Patient name: Samantha Knapp MRN [id number]  Date of birth: December 14, 1990 Chief Complaint:   Routine Prenatal Visit  History of Present Illness:   Samantha Knapp is a 30 y.o. G1P0 female at [redacted]w[redacted]d with an Estimated Date of Delivery: 02/04/22 being seen today for ongoing management of a low-risk pregnancy.  Today she reports  has degenerative disk back pain that is starting to flare up at times . Contractions: Not present. Vag. Bleeding: None.  Movement: Present. denies leaking of fluid. Review of Systems:   Pertinent items are noted in HPI Denies abnormal vaginal discharge w/ itching/odor/irritation, headaches, visual changes, shortness of breath, chest pain, abdominal pain, severe nausea/vomiting, or problems with urination or bowel movements unless otherwise stated above. Pertinent History Reviewed:  Reviewed past medical,surgical, social, obstetrical and family history.  Reviewed problem list, medications and allergies. Physical Assessment:   Vitals:   09/29/21 1454  BP: 123/73  Pulse: 91  Weight: 153 lb (69.4 kg)  Body mass index is 25.46 kg/m.        Physical Examination:   General appearance: Well appearing, and in no distress  Mental status: Alert, oriented to person, place, and time  Skin: Warm & dry  Cardiovascular: Normal heart rate noted  Respiratory: Normal respiratory effort, no distress  Abdomen: Soft, gravid, nontender  Pelvic: Cervical exam deferred         Extremities: Edema: None  Fetal Status: Fetal Heart Rate (bpm): 144 u/s   Movement: Present    Anatomy u/s: [place] 21+5 wks,cephalic,anterior placenta gr 0,cx 3.2 cm,normal ovaries,SVP of fluid 6.5 cm,FHR 144 bpm,EFW 541 g 92%,anatomy complete  No results found for this or any previous visit (from the past 24 hour(s)).  Assessment & Plan:  1) Low-risk pregnancy G1P0 at [redacted]w[redacted]d with an Estimated Date of Delivery: 02/04/22   2) Degenerative disk back pain, comfort measures  discussed including maternity support belt and chiro   Meds: No orders of the defined types were placed in this encounter.  Labs/procedures today: anatomy u/s  Plan:  Continue routine obstetrical care   Reviewed: Preterm labor symptoms and general obstetric precautions including but not limited to vaginal bleeding, contractions, leaking of fluid and fetal movement were reviewed in detail with the patient.  All questions were answered. Has home bp cuff. . Check bp weekly, let us know if >140/90.   Follow-up: Return for 4-5wks LROB, PN2.  No orders of the defined types were placed in this encounter.  Arabella Merles CNM 09/29/2021 3:21 PM

## 2021-09-29 NOTE — Progress Notes (Signed)
[place] 21+5 wks,cephalic,anterior placenta gr 0,cx 3.2 cm,normal ovaries,SVP of fluid 6.5 cm,FHR 144 bpm,EFW 541 g 92%,anatomy complete

## 2021-10-13 ENCOUNTER — Telehealth: Payer: Self-pay | Admitting: Advanced Practice Midwife

## 2021-10-13 MED ORDER — PREDNISONE 10 MG PO TABS: ORAL_TABLET | ORAL | 0 refills | Status: DC

## 2021-10-13 NOTE — Telephone Encounter (Signed)
She has been using some tape (KT tape)  and it has broke her out and was wanting to see if something could be called in.

## 2021-10-29 ENCOUNTER — Encounter: Payer: Self-pay | Admitting: Obstetrics & Gynecology

## 2021-10-29 ENCOUNTER — Other Ambulatory Visit: Payer: Self-pay

## 2021-10-29 ENCOUNTER — Other Ambulatory Visit: Payer: BC Managed Care – PPO

## 2021-10-29 ENCOUNTER — Ambulatory Visit (INDEPENDENT_AMBULATORY_CARE_PROVIDER_SITE_OTHER): Payer: BC Managed Care – PPO | Admitting: Obstetrics & Gynecology

## 2021-10-29 VITALS — BP 121/62 | HR 87 | Wt 158.0 lb

## 2021-10-29 DIAGNOSIS — Z3A26 26 weeks gestation of pregnancy: Secondary | ICD-10-CM

## 2021-10-29 DIAGNOSIS — Z3492 Encounter for supervision of normal pregnancy, unspecified, second trimester: Secondary | ICD-10-CM | POA: Diagnosis not present

## 2021-10-29 DIAGNOSIS — Z3402 Encounter for supervision of normal first pregnancy, second trimester: Secondary | ICD-10-CM

## 2021-10-29 NOTE — Progress Notes (Signed)
   LOW-RISK PREGNANCY VISIT Patient name: Samantha Knapp MRN [id number]  Date of birth: 27-Jun-1991 Chief Complaint:   Routine Prenatal Visit  History of Present Illness:   Samantha Knapp is a 30 y.o. G1P0 female at [redacted]w[redacted]d with an Estimated Date of Delivery: 02/04/22 being seen today for ongoing management of a low-risk pregnancy.  Depression screen [facility] 2/9 07/30/2021 08/05/2020  Decreased Interest 0 0  Down, Depressed, Hopeless 0 0  PHQ - 2 Score 0 0  Altered sleeping 0 -  Tired, decreased energy 1 -  Change in appetite 2 -  Feeling bad or failure about yourself  0 -  Trouble concentrating 0 -  Moving slowly or fidgety/restless 0 -  Suicidal thoughts 0 -  PHQ-9 Score 3 -    Today she reports no complaints. Contractions: Not present. Vag. Bleeding: None.  Movement: Present. denies leaking of fluid. Review of Systems:   Pertinent items are noted in HPI Denies abnormal vaginal discharge w/ itching/odor/irritation, headaches, visual changes, shortness of breath, chest pain, abdominal pain, severe nausea/vomiting, or problems with urination or bowel movements unless otherwise stated above. Pertinent History Reviewed:  Reviewed past medical,surgical, social, obstetrical and family history.  Reviewed problem list, medications and allergies. Physical Assessment:   Vitals:   10/29/21 0915  BP: 121/62  Pulse: 87  Weight: 158 lb (71.7 kg)  Body mass index is 26.29 kg/m.        Physical Examination:   General appearance: Well appearing, and in no distress  Mental status: Alert, oriented to person, place, and time  Skin: Warm & dry  Cardiovascular: Normal heart rate noted  Respiratory: Normal respiratory effort, no distress  Abdomen: Soft, gravid, nontender  Pelvic: Cervical exam deferred         Extremities: Edema: None  Fetal Status: Fetal Heart Rate (bpm): 142 Fundal Height: 25 cm Movement: Present    Chaperone: n/a    No results found for this or any previous  visit (from the past 24 hour(s)).  Assessment & Plan:  1) Low-risk pregnancy G1P0 at [redacted]w[redacted]d with an Estimated Date of Delivery: 02/04/22      Meds: No orders of the defined types were placed in this encounter.  Labs/procedures today: PN2  Plan:  Continue routine obstetrical care  Next visit: prefers     Reviewed: Preterm labor symptoms and general obstetric precautions including but not limited to vaginal bleeding, contractions, leaking of fluid and fetal movement were reviewed in detail with the patient.  All questions were answered. Has home bp cuff. Rx faxed to . Check bp weekly, let us know if >140/90.   Follow-up: Return in about 4 weeks (around 11/26/2021) for LROB.  No orders of the defined types were placed in this encounter.   Lazaro Arms, MD 10/29/2021 9:53 AM

## 2021-10-30 LAB — CBC
Hematocrit: 32.7 % — ABNORMAL LOW (ref 34.0–46.6)
Hemoglobin: 11.1 g/dL (ref 11.1–15.9)
MCH: 27.9 pg (ref 26.6–33.0)
MCHC: 33.9 g/dL (ref 31.5–35.7)
MCV: 82 fL (ref 79–97)
Platelets: 230 10*3/uL (ref 150–450)
RBC: 3.98 x10E6/uL (ref 3.77–5.28)
RDW: 12.4 % (ref 11.7–15.4)
WBC: 8.9 10*3/uL (ref 3.4–10.8)

## 2021-10-30 LAB — GLUCOSE TOLERANCE, 2 HOURS W/ 1HR
Glucose, 1 hour: 119 mg/dL (ref 70–179)
Glucose, 2 hour: 113 mg/dL (ref 70–152)
Glucose, Fasting: 81 mg/dL (ref 70–91)

## 2021-10-30 LAB — RPR: RPR Ser Ql: NONREACTIVE

## 2021-10-30 LAB — HIV ANTIBODY (ROUTINE TESTING W REFLEX): HIV Screen 4th Generation wRfx: NONREACTIVE

## 2021-10-30 LAB — ANTIBODY SCREEN: Antibody Screen: NEGATIVE

## 2021-11-26 ENCOUNTER — Ambulatory Visit (INDEPENDENT_AMBULATORY_CARE_PROVIDER_SITE_OTHER): Payer: BC Managed Care – PPO | Admitting: Medical

## 2021-11-26 ENCOUNTER — Encounter: Payer: Self-pay | Admitting: Medical

## 2021-11-26 ENCOUNTER — Other Ambulatory Visit: Payer: Self-pay

## 2021-11-26 VITALS — BP 106/69 | HR 75 | Wt 160.0 lb

## 2021-11-26 DIAGNOSIS — G8929 Other chronic pain: Secondary | ICD-10-CM

## 2021-11-26 DIAGNOSIS — Z3403 Encounter for supervision of normal first pregnancy, third trimester: Secondary | ICD-10-CM

## 2021-11-26 DIAGNOSIS — Z23 Encounter for immunization: Secondary | ICD-10-CM | POA: Diagnosis not present

## 2021-11-26 DIAGNOSIS — Z3A30 30 weeks gestation of pregnancy: Secondary | ICD-10-CM

## 2021-11-26 DIAGNOSIS — M545 Low back pain, unspecified: Secondary | ICD-10-CM

## 2021-11-26 NOTE — Progress Notes (Signed)
° °  PRENATAL VISIT NOTE  Subjective:  Samantha Knapp is a 30 y.o. G1P0 at [redacted]w[redacted]d being seen today for ongoing prenatal care.  She is currently monitored for the following issues for this low-risk pregnancy and has Chronic low back pain; Supervision of normal first pregnancy; and Cervical high risk HPV (human papillomavirus) test positive on their problem list.  Patient reports backache.  Contractions: Not present. Vag. Bleeding: None.  Movement: Present. Denies leaking of fluid.   The following portions of the patient's history were reviewed and updated as appropriate: allergies, current medications, past family history, past medical history, past social history, past surgical history and problem list.   Objective:   Vitals:   11/26/21 0833  BP: 106/69  Pulse: 75  Weight: 160 lb (72.6 kg)    Fetal Status: Fetal Heart Rate (bpm): 132 Fundal Height: 30 cm Movement: Present     General:  Alert, oriented and cooperative. Patient is in no acute distress.  Skin: Skin is warm and dry. No rash noted.   Cardiovascular: Normal heart rate noted  Respiratory: Normal respiratory effort, no problems with respiration noted  Abdomen: Soft, gravid, appropriate for gestational age.  Pain/Pressure: Present     Pelvic: Cervical exam deferred        Extremities: Normal range of motion.  Edema: Trace  Mental Status: Normal mood and affect. Normal behavior. Normal judgment and thought content.   Assessment and Plan:  Pregnancy: G1P0 at [redacted]w[redacted]d 1. Encounter for supervision of normal first pregnancy in third trimester - Tdap vaccine greater than or equal to 7yo IM - still considering MOC, possibly IUD - Normal third trimester labs reviewed - Anticipatory guidance for future visit cadence discussed   2. Chronic midline low back pain without sciatica - Advised regular use of abdominal binder   3. [redacted] weeks gestation of pregnancy  Preterm labor symptoms and general obstetric precautions including  but not limited to vaginal bleeding, contractions, leaking of fluid and fetal movement were reviewed in detail with the patient. Please refer to After Visit Summary for other counseling recommendations.   Return in about 2 weeks (around 12/10/2021) for LOB, In-Person, any provider.  No future appointments.  Vonzella Nipple, PA-C

## 2021-11-28 NOTE — L&D Delivery Note (Signed)
OB/GYN Faculty Practice Delivery Note ? ?Samantha Knapp is a 31 y.o. G1P0 s/p VD at [redacted]w[redacted]d. She was admitted for IOL due to gestational hypertension.  ? ?ROM: 16h 79m with clear fluid ?GBS Status: Negative/-- (02/10 1142) ?Maximum Maternal Temperature: 99.23F ? ?Labor Progress: ?Initial SVE: 1.5/thick. She then progressed to complete with cytotec and SROM. Pitocin added with pushing.  ? ?Delivery Date/Time: 3/13 at 0457  ?Delivery: Called to room and patient was complete and pushing. She pushed for approximately 3.5 hours. Head delivered L OA. Loose nuchal cord present and reduced at the perineum. Encountered difficulty delivering the anterior shoulder (right arm). Initially placed into [place] and rotated the anterior shoulder to the right without success. Attempted to deliver posterior arm but unable to reach completely. Afterwards given suprapubic pressure while rotating the anterior shoulder and the shoulder was then delivered. Dystocia lasted approximately 1 minute and 45 seconds. The remainder of the body delivered in usual fashion. Infant with poor tone, placed on mother's abdomen, dried and stimulated. Cord clamped x 2 immediately, and cut by provider. Cord blood drawn. Placenta delivered spontaneously with gentle cord traction. Fundus firm with massage and Pitocin. Post-placental IUD was placed, see separate procedure note. Labia, perineum, vagina, and cervix inspected with a shallow 2nd degree perineal laceration that extended into the left labia which was repaired with 3.0 vicryl in usual fashion.  ? ?Baby Weight: pending ? ?Placenta: 3 vessel, intact. Sent to L&D ?Complications: Shoulder dystocia as above  ?Lacerations: 2nd degree perineal  ?EBL: 100 mL ?Analgesia: Epidural  ? ?Infant:  ?APGAR (1 MIN): 6   ?APGAR (5 MINS): 9   ? ?Samantha Penna, DO  ?[facility] Family Medicine Fellow, Faculty Practice ?[facility] for [employer], [facility] ?02/07/2022, 6:12 AM ? ? ? ?  ?

## 2021-12-08 ENCOUNTER — Encounter (HOSPITAL_COMMUNITY): Payer: Self-pay | Admitting: Obstetrics & Gynecology

## 2021-12-08 ENCOUNTER — Inpatient Hospital Stay (HOSPITAL_COMMUNITY)
Admission: AD | Admit: 2021-12-08 | Discharge: 2021-12-08 | Disposition: A | Discharge status: Home / Self Care | Payer: BC Managed Care – PPO | Attending: Obstetrics & Gynecology | Admitting: Obstetrics & Gynecology

## 2021-12-08 ENCOUNTER — Other Ambulatory Visit: Payer: Self-pay

## 2021-12-08 DIAGNOSIS — O26893 Other specified pregnancy related conditions, third trimester: Secondary | ICD-10-CM | POA: Diagnosis not present

## 2021-12-08 DIAGNOSIS — O368130 Decreased fetal movements, third trimester, not applicable or unspecified: Secondary | ICD-10-CM

## 2021-12-08 DIAGNOSIS — R1011 Right upper quadrant pain: Secondary | ICD-10-CM | POA: Insufficient documentation

## 2021-12-08 DIAGNOSIS — Z3A31 31 weeks gestation of pregnancy: Secondary | ICD-10-CM | POA: Insufficient documentation

## 2021-12-08 DIAGNOSIS — M549 Dorsalgia, unspecified: Secondary | ICD-10-CM | POA: Insufficient documentation

## 2021-12-08 DIAGNOSIS — Z3689 Encounter for other specified antenatal screening: Secondary | ICD-10-CM | POA: Insufficient documentation

## 2021-12-08 LAB — URINALYSIS, ROUTINE W REFLEX MICROSCOPIC
Bilirubin Urine: NEGATIVE
Glucose, UA: 100 mg/dL — AB
Ketones, ur: NEGATIVE mg/dL
Leukocytes,Ua: NEGATIVE
Nitrite: NEGATIVE
Protein, ur: NEGATIVE mg/dL
Specific Gravity, Urine: <1.005 — ABNORMAL LOW (ref 1.005–1.030)
pH: 6.5 (ref 5.0–8.0)

## 2021-12-08 LAB — URINALYSIS, MICROSCOPIC (REFLEX)

## 2021-12-08 NOTE — MAU Provider Note (Signed)
History     CSN: [id number]  Arrival date and time: 12/08/21 1247   Event Date/Time   First Provider Initiated Contact with Patient 12/08/21 1348      Chief Complaint  Patient presents with   Decreased Fetal Movement   Samantha Knapp is a 31 y.o. G1P0 at [redacted]w[redacted]d who receives care at [facility].  She presents  today for Decreased Fetal Movement.  She reports she woke up this morning and noted fetal movement was not as frequent or "normal movements."  She states she tried drinking cold drinks, pushing on her abdomen, and resting with no improvement.  She states she called her office and was told to come in for evaluation.  She states she did have a HA, this morning, which resolved without intervention.  She also reports a bout of blurry vision, while driving, that resolved after about 30 minutes. She reports she was eating a Poptart at the time and is typical to not have breakfast in the AM. She denies wearing sunglasses at the time and last eye doctor appt was one year ago.  She also reports she has since eaten a Croissant, a Hashbrown, and Pancakes after. Patient denies current abdominal cramping or contractions.  She does report some RUQ pain that is present after laying down that is becoming more frequent. She states the pain used to resolve with position change, but is now taking longer.  She has no current pain.  She also endorses current fetal movement.    OB History     Gravida  1   Para      Term      Preterm      AB      Living         SAB      IAB      Ectopic      Multiple      Live Births              Past Medical History:  Diagnosis Date   DDD (degenerative disc disease), lumbar    Frequent UTI    Migraines    Has improved.    Past Surgical History:  Procedure Laterality Date   NO PAST SURGERIES     TYMPANOSTOMY TUBE PLACEMENT      Family History  Problem Relation Age of Onset   Diabetes Mother    Hyperlipidemia Mother    Scoliosis  Brother    Headache Brother    Diabetes Maternal Grandmother    Lung cancer Maternal Grandmother    Diabetes Maternal Grandfather    Esophageal cancer Maternal Grandfather    Cancer Paternal Grandmother    Breast cancer Paternal Grandmother    Lymphoma Paternal Grandfather     Social History   Tobacco Use   Smoking status: Never   Smokeless tobacco: Never  Vaping Use   Vaping Use: Never used  Substance Use Topics   Alcohol use: Not Currently    Comment: occ   Drug use: Not Currently    Allergies: No Known Allergies  Medications Prior to Admission  Medication Sig Dispense Refill Last Dose   aspirin 81 MG EC tablet Take 1 tablet (81 mg total) by mouth daily. Swallow whole. (Patient not taking: Reported on 10/29/2021) 90 tablet 3    Blood Pressure Monitor MISC For regular home bp monitoring during pregnancy 1 each 0    Pediatric Multivit-Minerals-C (FLINTSTONES GUMMIES) chewable tablet Chew 2 tablets by mouth at bedtime. (Patient not  taking: Reported on 10/29/2021)       Review of Systems  Constitutional:  Negative for chills and fever.  Eyes:  Positive for visual disturbance (Blurry Vision x 1, None currently).  Respiratory:  Negative for shortness of breath.   Cardiovascular:  Negative for chest pain.  Gastrointestinal:  Positive for abdominal pain (RUQ Pain-None currently). Negative for constipation, diarrhea, nausea and vomiting.  Genitourinary:  Negative for difficulty urinating, dysuria, vaginal bleeding and vaginal discharge.  Musculoskeletal:  Positive for back pain.  Neurological:  Positive for headaches (Resolved). Negative for dizziness and light-headedness.  Psychiatric/Behavioral:  Hyperactive: Lower Back-H/O Degen. Disc.   Physical Exam   Blood pressure 126/70, pulse 78, temperature 98.3 F (36.8 C), resp. rate 12, last menstrual period 04/30/2021, SpO2 100 %.  Physical Exam Vitals reviewed.  Constitutional:      Appearance: Normal appearance.  HENT:      Head: Normocephalic and atraumatic.  Eyes:     Conjunctiva/sclera: Conjunctivae normal.  Cardiovascular:     Rate and Rhythm: Normal rate and regular rhythm.  Pulmonary:     Effort: Pulmonary effort is normal.     Breath sounds: Normal breath sounds.  Abdominal:     General: Bowel sounds are normal.  Musculoskeletal:     Cervical back: Normal range of motion.  Skin:    General: Skin is warm and dry.  Neurological:     Mental Status: She is alert and oriented to person, place, and time.  Psychiatric:        Mood and Affect: Mood normal.        Behavior: Behavior normal.        Thought Content: Thought content normal.    Fetal Assessment 125 bpm, Mod Var, -Decels, +15x15 Accels Toco: No ctx graphed  MAU Course   Results for orders placed or performed during the hospital encounter of 12/08/21 (from the past 24 hour(s))  Urinalysis, Routine w reflex microscopic Urine, Clean Catch     Status: Abnormal   Collection Time: 12/08/21  1:47 PM  Result Value Ref Range   Color, Urine YELLOW YELLOW   APPearance CLEAR CLEAR   Specific Gravity, Urine <1.005 (L) 1.005 - 1.030   pH 6.5 5.0 - 8.0   Glucose, UA 100 (A) NEGATIVE mg/dL   Hgb urine dipstick TRACE (A) NEGATIVE   Bilirubin Urine NEGATIVE NEGATIVE   Ketones, ur NEGATIVE NEGATIVE mg/dL   Protein, ur NEGATIVE NEGATIVE mg/dL   Nitrite NEGATIVE NEGATIVE   Leukocytes,Ua NEGATIVE NEGATIVE  Urinalysis, Microscopic (reflex)     Status: Abnormal   Collection Time: 12/08/21  1:47 PM  Result Value Ref Range   RBC / HPF 0-5 0 - 5 RBC/hpf   WBC, UA 0-5 0 - 5 WBC/hpf   Bacteria, UA RARE (A) NONE SEEN   Squamous Epithelial / LPF 0-5 0 - 5   Mucus PRESENT    No results found.  MDM PE Labs: UA EFM Nutritional Education Assessment and Plan  31 year old G1P0  SIUP at 31.5 weeks Cat I FT DFM Headache-Resolved Blurry Vision-Resolved  -Reviewed c/o visual disturbance and HA now resolved. -Discussed eating habits and nutritional  intake that can contribute to these symptoms. -Encouraged increased protein throughout the day. -Will provide information in AVS. -Reassured that BP normal and instructed to monitor symptoms.  -Reviewed perception of fetal movements which patient states is present now.  -Patient instructed to monitor RUQ pain.  Informed that symptoms could be from fetal position. -Discussed usage  of position changes, heating pad, cold compress, and tylenol. -Instructed to notify office staff if symptoms continue despite interventions.  -Patient without other questions or concerns. -Instructed to keep next appt as scheduled. -Encouraged to call primary office or return to MAU if symptoms worsen or with the onset of new symptoms. -Discharged to home in stable condition.  Maryann Conners MSN, CNM 12/08/2021, 1:48 PM

## 2021-12-08 NOTE — MAU Note (Addendum)
Pt reports decreased fetal movement since this morning.   Pt reports she woke up with a slight headache and had blurry vision in her left eye on the way to work for about 30 minutes.   Denies taking anything for the headache. Reports the headache is completely gone now.   Denies LOF or vaginal bleeding.

## 2021-12-10 ENCOUNTER — Encounter: Payer: Self-pay | Admitting: Medical

## 2021-12-10 ENCOUNTER — Ambulatory Visit (INDEPENDENT_AMBULATORY_CARE_PROVIDER_SITE_OTHER): Payer: BC Managed Care – PPO | Admitting: Medical

## 2021-12-10 ENCOUNTER — Other Ambulatory Visit: Payer: Self-pay

## 2021-12-10 VITALS — BP 114/65 | HR 96 | Wt 162.8 lb

## 2021-12-10 DIAGNOSIS — Z3403 Encounter for supervision of normal first pregnancy, third trimester: Secondary | ICD-10-CM

## 2021-12-10 DIAGNOSIS — Z3A32 32 weeks gestation of pregnancy: Secondary | ICD-10-CM

## 2021-12-10 NOTE — Progress Notes (Signed)
° °  PRENATAL VISIT NOTE  Subjective:  Samantha Knapp is a 31 y.o. G1P0 at [redacted]w[redacted]d being seen today for ongoing prenatal care.  She is currently monitored for the following issues for this low-risk pregnancy and has Chronic low back pain; Supervision of normal first pregnancy; and Cervical high risk HPV (human papillomavirus) test positive on their problem list.  Patient reports no complaints.  Contractions: Not present. Vag. Bleeding: None.  Movement: Present. Denies leaking of fluid.   The following portions of the patient's history were reviewed and updated as appropriate: allergies, current medications, past family history, past medical history, past social history, past surgical history and problem list.   Objective:   Vitals:   12/10/21 0907  BP: 114/65  Pulse: 96  Weight: 162 lb 12.8 oz (73.8 kg)    Fetal Status: Fetal Heart Rate (bpm): 132 Fundal Height: 32 cm Movement: Present     General:  Alert, oriented and cooperative. Patient is in no acute distress.  Skin: Skin is warm and dry. No rash noted.   Cardiovascular: Normal heart rate noted  Respiratory: Normal respiratory effort, no problems with respiration noted  Abdomen: Soft, gravid, appropriate for gestational age.  Pain/Pressure: Absent     Pelvic: Cervical exam deferred        Extremities: Normal range of motion.  Edema: Trace  Mental Status: Normal mood and affect. Normal behavior. Normal judgment and thought content.   Assessment and Plan:  Pregnancy: G1P0 at [redacted]w[redacted]d 1. Encounter for supervision of normal first pregnancy in third trimester - Doing well - Desires IUD for [facility] - Reviewed normal GTT and third trimester labs from previous visit - Patient was seen since last visit for headache and blurred vision, normotensive today and at time of evaluation - Discussed warning signs for HTN in pregnancy  - Encouraged small frequent meals throughout the day to avoid drastic changes in blood glucose   2. [redacted] weeks  gestation of pregnancy  Preterm labor symptoms and general obstetric precautions including but not limited to vaginal bleeding, contractions, leaking of fluid and fetal movement were reviewed in detail with the patient. Please refer to After Visit Summary for other counseling recommendations.   Return in about 2 weeks (around 12/24/2021) for LOB, In-Person, any provider.  Future Appointments  Date Time Provider [place]  12/27/2021  3:50 PM Roma Schanz, CNM [facility]    Kerry Hough, PA-C

## 2021-12-20 ENCOUNTER — Inpatient Hospital Stay (HOSPITAL_COMMUNITY)
Admission: AD | Admit: 2021-12-20 | Discharge: 2021-12-20 | Disposition: A | Discharge status: Home / Self Care | Payer: BC Managed Care – PPO | Attending: Obstetrics and Gynecology | Admitting: Obstetrics and Gynecology

## 2021-12-20 ENCOUNTER — Other Ambulatory Visit: Payer: Self-pay

## 2021-12-20 ENCOUNTER — Telehealth: Payer: Self-pay | Admitting: *Deleted

## 2021-12-20 ENCOUNTER — Encounter (HOSPITAL_COMMUNITY): Payer: Self-pay | Admitting: Obstetrics and Gynecology

## 2021-12-20 DIAGNOSIS — Z3689 Encounter for other specified antenatal screening: Secondary | ICD-10-CM | POA: Diagnosis not present

## 2021-12-20 DIAGNOSIS — N898 Other specified noninflammatory disorders of vagina: Secondary | ICD-10-CM | POA: Diagnosis not present

## 2021-12-20 DIAGNOSIS — O26893 Other specified pregnancy related conditions, third trimester: Secondary | ICD-10-CM | POA: Insufficient documentation

## 2021-12-20 DIAGNOSIS — Z3403 Encounter for supervision of normal first pregnancy, third trimester: Secondary | ICD-10-CM

## 2021-12-20 DIAGNOSIS — Z3A33 33 weeks gestation of pregnancy: Secondary | ICD-10-CM | POA: Insufficient documentation

## 2021-12-20 DIAGNOSIS — O368130 Decreased fetal movements, third trimester, not applicable or unspecified: Secondary | ICD-10-CM | POA: Diagnosis not present

## 2021-12-20 LAB — WET PREP, GENITAL
Clue Cells Wet Prep HPF POC: NONE SEEN
Sperm: NONE SEEN
Trich, Wet Prep: NONE SEEN
WBC, Wet Prep HPF POC: >=10 — AB (ref <10)
Yeast Wet Prep HPF POC: NONE SEEN

## 2021-12-20 NOTE — Telephone Encounter (Signed)
Pt states she is possibly leaking fluid. Her panties are wet. She noticed this Friday; none Saturday; + leaking on Sunday and Monday. Pt has also had decreased baby movement. Pt was advised to go to [facility] for evaluation. Pt voiced understanding. JSY

## 2021-12-20 NOTE — MAU Provider Note (Signed)
Patient Samantha Knapp is a 31 y.o. G1P0  At [redacted]w[redacted]d here with complaints of LOF and decreased fetal movements. She reports that baby was moving normally over the weekend but had decreased fetal movements today. She denies vaginal bleeding, contractions. She denies chest pain, fever. She denies any pain with urination, NV, diarrhea, constipation. She has had a low risk pregnancy thus far.   She also reports some spots of wetness on her underwear on Friday, Sat and today. She became nervous and so she came to MAU to be seen after notifying [facility].  History     CSN: [id number]  Arrival date and time: 12/20/21 1737   Event Date/Time   First Provider Initiated Contact with Patient 12/20/21 1813      Chief Complaint  Patient presents with   Decreased Fetal Movement   Rupture of Membranes   Vaginal Discharge The patient's primary symptoms include vaginal discharge. The patient's pertinent negatives include no vaginal bleeding. This is a new problem. The current episode started in the past 7 days. The problem occurs intermittently. Progression since onset: happened once on Friday, once on Sat and once today. Pertinent negatives include no constipation, diarrhea, dysuria, fever, urgency or vomiting. The vaginal discharge was clear and mucoid. There has been no bleeding. She has not been passing clots. She has not been passing tissue.   OB History     Gravida  1   Para      Term      Preterm      AB      Living         SAB      IAB      Ectopic      Multiple      Live Births              Past Medical History:  Diagnosis Date   DDD (degenerative disc disease), lumbar    Frequent UTI    Migraines    Has improved.    Past Surgical History:  Procedure Laterality Date   NO PAST SURGERIES     TYMPANOSTOMY TUBE PLACEMENT      Family History  Problem Relation Age of Onset   Diabetes Mother    Hyperlipidemia Mother    Scoliosis Brother    Headache Brother     Diabetes Maternal Grandmother    Lung cancer Maternal Grandmother    Diabetes Maternal Grandfather    Esophageal cancer Maternal Grandfather    Cancer Paternal Grandmother    Breast cancer Paternal Grandmother    Lymphoma Paternal Grandfather     Social History   Tobacco Use   Smoking status: Never   Smokeless tobacco: Never  Vaping Use   Vaping Use: Never used  Substance Use Topics   Alcohol use: Not Currently    Comment: occ   Drug use: Not Currently    Allergies: No Known Allergies  Medications Prior to Admission  Medication Sig Dispense Refill Last Dose   Pediatric Multivit-Minerals-C (FLINTSTONES GUMMIES) chewable tablet Chew 2 tablets by mouth at bedtime.   Past Week   aspirin 81 MG EC tablet Take 1 tablet (81 mg total) by mouth daily. Swallow whole. (Patient not taking: Reported on 10/29/2021) 90 tablet 3    Blood Pressure Monitor MISC For regular home bp monitoring during pregnancy 1 each 0     Review of Systems  Constitutional:  Negative for fever.  Respiratory: Negative.    Cardiovascular: Negative.   Gastrointestinal:  Negative for constipation, diarrhea and vomiting.  Genitourinary:  Positive for vaginal discharge. Negative for dysuria and urgency.  Allergic/Immunologic: Negative.   Neurological: Negative.   Psychiatric/Behavioral: Negative.    Physical Exam   Last menstrual period 04/30/2021.  Physical Exam Constitutional:      Appearance: Normal appearance.  Pulmonary:     Effort: Pulmonary effort is normal.  Abdominal:     General: Abdomen is flat.  Genitourinary:    General: Normal vulva.     Comments: NEFG; milky white discharge present in the vagina, no pooling. SVE not performed.  Skin:    General: Skin is warm.  Neurological:     General: No focal deficit present.     Mental Status: She is alert.  Psychiatric:        Mood and Affect: Mood normal.        Behavior: Behavior normal.    MAU Course  Procedures  MDM -NST: 130 bpm, mod  var, present acel, no decels, no contractions -Patient wet prep normal -sterile fern negative, no pooling in vagina, no LOF while in MAU -patient felt strong fetal movements while in MAU; she appears relaxed and happy during her time.  Assessment and Plan   1. Encounter for supervision of normal first pregnancy in third trimester   2. [redacted] weeks gestation of pregnancy   3. Vaginal discharge during pregnancy in third trimester   4. NST (non-stress test) reactive   -discussed FM/kick counts and when to come to MAU, discussed warning signs in third trimester -keep appt on 30 -return to MAU if any concerns -GC CT pending  Charlesetta Garibaldi Beau [place] 12/20/2021, 6:20 PM

## 2021-12-20 NOTE — MAU Note (Signed)
Pt reports on Friday she had small spot of wetness in her underwear. Nothing on Saturday. Had some yesterday and today. Also reports baby has not moved much today. Denies any vag bleeding or cramping.

## 2021-12-21 ENCOUNTER — Encounter: Payer: BC Managed Care – PPO | Admitting: Obstetrics & Gynecology

## 2021-12-21 LAB — GC/CHLAMYDIA PROBE AMP (~~LOC~~) NOT AT ARMC
Chlamydia: NEGATIVE
Comment: NEGATIVE
Comment: NORMAL
Neisseria Gonorrhea: NEGATIVE

## 2021-12-27 ENCOUNTER — Ambulatory Visit (INDEPENDENT_AMBULATORY_CARE_PROVIDER_SITE_OTHER): Payer: BC Managed Care – PPO | Admitting: Obstetrics & Gynecology

## 2021-12-27 ENCOUNTER — Other Ambulatory Visit: Payer: Self-pay

## 2021-12-27 ENCOUNTER — Encounter: Payer: Self-pay | Admitting: Obstetrics & Gynecology

## 2021-12-27 VITALS — BP 117/70 | HR 87 | Wt 166.0 lb

## 2021-12-27 DIAGNOSIS — Z3403 Encounter for supervision of normal first pregnancy, third trimester: Secondary | ICD-10-CM

## 2021-12-27 MED ORDER — OMEPRAZOLE 20 MG PO CPDR: 20.0000 mg | DELAYED_RELEASE_CAPSULE | Freq: Every day | ORAL | 6 refills | Status: AC

## 2021-12-27 NOTE — Progress Notes (Signed)
° °  LOW-RISK PREGNANCY VISIT Patient name: Samantha Knapp MRN [id number]  Date of birth: 03/09/1991 Chief Complaint:   Routine Prenatal Visit  History of Present Illness:   Samantha Knapp is a 31 y.o. G1P0 female at [redacted]w[redacted]d with an Estimated Date of Delivery: 02/04/22 being seen today for ongoing management of a low-risk pregnancy.  Depression screen [facility] 2/9 10/29/2021 07/30/2021 08/05/2020  Decreased Interest 0 0 0  Down, Depressed, Hopeless 0 0 0  PHQ - 2 Score 0 0 0  Altered sleeping 1 0 -  Tired, decreased energy 1 1 -  Change in appetite 0 2 -  Feeling bad or failure about yourself  0 0 -  Trouble concentrating 0 0 -  Moving slowly or fidgety/restless 0 0 -  Suicidal thoughts 0 0 -  PHQ-9 Score 2 3 -    Today she reports no complaints. Contractions: Not present. Vag. Bleeding: None.  Movement: Present. denies leaking of fluid. Review of Systems:   Pertinent items are noted in HPI Denies abnormal vaginal discharge w/ itching/odor/irritation, headaches, visual changes, shortness of breath, chest pain, abdominal pain, severe nausea/vomiting, or problems with urination or bowel movements unless otherwise stated above. Pertinent History Reviewed:  Reviewed past medical,surgical, social, obstetrical and family history.  Reviewed problem list, medications and allergies. Physical Assessment:   Vitals:   12/27/21 1539  BP: 117/70  Pulse: 87  Weight: 166 lb (75.3 kg)  Body mass index is 27.62 kg/m.        Physical Examination:   General appearance: Well appearing, and in no distress  Mental status: Alert, oriented to person, place, and time  Skin: Warm & dry  Cardiovascular: Normal heart rate noted  Respiratory: Normal respiratory effort, no distress  Abdomen: Soft, gravid, nontender  Pelvic: Cervical exam deferred         Extremities: Edema: Trace  Fetal Status: Fetal Heart Rate (bpm): 125 Fundal Height: 34 cm Movement: Present    Chaperone: n/a    No results  found for this or any previous visit (from the past 24 hour(s)).  Assessment & Plan:  1) Low-risk pregnancy G1P0 at [redacted]w[redacted]d with an Estimated Date of Delivery: 02/04/22      Meds:  Meds ordered this encounter  Medications   omeprazole (PRILOSEC) 20 MG capsule    Sig: Take 1 capsule (20 mg total) by mouth daily. 1 tablet a day    Dispense:  30 capsule    Refill:  6   Labs/procedures today:   Plan:  Continue routine obstetrical care  Next visit: prefers in person    Reviewed: Preterm labor symptoms and general obstetric precautions including but not limited to vaginal bleeding, contractions, leaking of fluid and fetal movement were reviewed in detail with the patient.  All questions were answered. Has home bp cuff. Rx faxed to . Check bp weekly, let us know if >140/90.   Follow-up: Return in about 2 weeks (around 01/10/2022) for LROB.  No orders of the defined types were placed in this encounter.   Lazaro Arms, MD 12/27/2021 3:59 PM

## 2022-01-07 ENCOUNTER — Ambulatory Visit (INDEPENDENT_AMBULATORY_CARE_PROVIDER_SITE_OTHER): Payer: BC Managed Care – PPO | Admitting: Advanced Practice Midwife

## 2022-01-07 ENCOUNTER — Encounter: Payer: Self-pay | Admitting: Advanced Practice Midwife

## 2022-01-07 ENCOUNTER — Other Ambulatory Visit: Payer: Self-pay

## 2022-01-07 VITALS — BP 118/70 | HR 83 | Wt 167.0 lb

## 2022-01-07 DIAGNOSIS — Z3A36 36 weeks gestation of pregnancy: Secondary | ICD-10-CM

## 2022-01-07 DIAGNOSIS — Z3403 Encounter for supervision of normal first pregnancy, third trimester: Secondary | ICD-10-CM

## 2022-01-07 LAB — OB RESULTS CONSOLE GC/CHLAMYDIA: Gonorrhea: NEGATIVE

## 2022-01-07 NOTE — Addendum Note (Signed)
Addended by: Cam Hai D on: 01/07/2022 11:52 PM   Modules accepted: Orders

## 2022-01-07 NOTE — Progress Notes (Addendum)
° °  LOW-RISK PREGNANCY VISIT Patient name: Samantha Knapp MRN [id number]  Date of birth: 1991/11/18 Chief Complaint:   Routine Prenatal Visit (GBS today; feet swelling)  History of Present Illness:   Samantha Knapp is a 31 y.o. G1P0 female at [redacted]w[redacted]d with an Estimated Date of Delivery: 02/04/22 being seen today for ongoing management of a low-risk pregnancy.  Today she reports  back pain and other discomforts of late preg . Contractions: Not present. Vag. Bleeding: None.  Movement: Present. denies leaking of fluid. Review of Systems:   Pertinent items are noted in HPI Denies abnormal vaginal discharge w/ itching/odor/irritation, headaches, visual changes, shortness of breath, chest pain, abdominal pain, severe nausea/vomiting, or problems with urination or bowel movements unless otherwise stated above. Pertinent History Reviewed:  Reviewed past medical,surgical, social, obstetrical and family history.  Reviewed problem list, medications and allergies. Physical Assessment:   Vitals:   01/07/22 1048  BP: 118/70  Pulse: 83  Weight: 167 lb (75.8 kg)  Body mass index is 27.79 kg/m.        Physical Examination:   General appearance: Well appearing, and in no distress  Mental status: Alert, oriented to person, place, and time  Skin: Warm & dry  Cardiovascular: Normal heart rate noted  Respiratory: Normal respiratory effort, no distress  Abdomen: Soft, gravid, nontender  Pelvic: Cervical exam performed  Dilation: Closed Effacement (%): Thick Station: Ballotable; bedside u/s: frank breech w head to mat L  Extremities: Edema: Trace  Fetal Status: Fetal Heart Rate (bpm): 144 Fundal Height: 36 cm Movement: Present Presentation: [name] Breech  No results found for this or any previous visit (from the past 24 hour(s)).  Assessment & Plan:  1) Low-risk pregnancy G1P0 at [redacted]w[redacted]d with an Estimated Date of Delivery: 02/04/22   2) Breech presentation, rev'd options of ECV, awaiting labor  and hoping he's vertex, or scheduled pLTCS; she and her husband will discuss and call if they want an ECV scheduled for ~ 37wks **Called back to office and wants ECV- scheduled for 01/17/22.   Meds: No orders of the defined types were placed in this encounter.  Labs/procedures today: SVE, bedside U/S, GBS (GC/chlam neg 12/20/21)  Plan:  Continue routine obstetrical care   Reviewed: Term labor symptoms and general obstetric precautions including but not limited to vaginal bleeding, contractions, leaking of fluid and fetal movement were reviewed in detail with the patient.  All questions were answered. Has home bp cuff. Check bp weekly, let us know if >140/90.   Follow-up: Return for schedule weekly LROB x 4 weeks.  Orders Placed This Encounter  Procedures   Strep Gp B NAA   Arabella Merles [facility] 01/07/2022 11:56 AM

## 2022-01-09 LAB — STREP GP B NAA: Strep Gp B NAA: NEGATIVE

## 2022-01-10 ENCOUNTER — Telehealth (HOSPITAL_COMMUNITY): Payer: Self-pay | Admitting: *Deleted

## 2022-01-10 ENCOUNTER — Encounter (HOSPITAL_COMMUNITY): Payer: Self-pay | Admitting: *Deleted

## 2022-01-10 NOTE — Telephone Encounter (Signed)
Preadmission screen  

## 2022-01-14 ENCOUNTER — Other Ambulatory Visit: Payer: Self-pay

## 2022-01-14 ENCOUNTER — Ambulatory Visit (INDEPENDENT_AMBULATORY_CARE_PROVIDER_SITE_OTHER): Payer: BC Managed Care – PPO | Admitting: Obstetrics & Gynecology

## 2022-01-14 VITALS — BP 121/71 | HR 82 | Wt 173.0 lb

## 2022-01-14 DIAGNOSIS — O321XX0 Maternal care for breech presentation, not applicable or unspecified: Secondary | ICD-10-CM

## 2022-01-14 DIAGNOSIS — Z3403 Encounter for supervision of normal first pregnancy, third trimester: Secondary | ICD-10-CM

## 2022-01-14 NOTE — Progress Notes (Signed)
° °  LOW-RISK PREGNANCY VISIT Patient name: Samantha Knapp MRN [id number]  Date of birth: Dec 22, 1990 Chief Complaint:   Routine Prenatal Visit  History of Present Illness:   Samantha Knapp is a 31 y.o. G1P0 female at [redacted]w[redacted]d with an Estimated Date of Delivery: 02/04/22 being seen today for ongoing management of a low-risk pregnancy.  Depression screen [facility] 2/9 10/29/2021 07/30/2021 08/05/2020  Decreased Interest 0 0 0  Down, Depressed, Hopeless 0 0 0  PHQ - 2 Score 0 0 0  Altered sleeping 1 0 -  Tired, decreased energy 1 1 -  Change in appetite 0 2 -  Feeling bad or failure about yourself  0 0 -  Trouble concentrating 0 0 -  Moving slowly or fidgety/restless 0 0 -  Suicidal thoughts 0 0 -  PHQ-9 Score 2 3 -    Today she reports no complaints. Contractions: Not present. Vag. Bleeding: None.  Movement: Present. denies leaking of fluid. Review of Systems:   Pertinent items are noted in HPI Denies abnormal vaginal discharge w/ itching/odor/irritation, headaches, visual changes, shortness of breath, chest pain, abdominal pain, severe nausea/vomiting, or problems with urination or bowel movements unless otherwise stated above. Pertinent History Reviewed:  Reviewed past medical,surgical, social, obstetrical and family history.  Reviewed problem list, medications and allergies. Physical Assessment:   Vitals:   01/14/22 1046  BP: 121/71  Pulse: 82  Weight: 173 lb (78.5 kg)  Body mass index is 28.79 kg/m.        Physical Examination:   General appearance: Well appearing, and in no distress  Mental status: Alert, oriented to person, place, and time  Skin: Warm & dry  Cardiovascular: Normal heart rate noted  Respiratory: Normal respiratory effort, no distress  Abdomen: Soft, gravid, nontender  Pelvic: Cervical exam deferred         Extremities: Edema: Trace  Fetal Status: Fetal Heart Rate (bpm): 128 Fundal Height: 37 cm Movement: Present Presentation: [name]  Breech  Chaperone: n/a    No results found for this or any previous visit (from the past 24 hour(s)).  Assessment & Plan:  1) Low-risk pregnancy G1P0 at [redacted]w[redacted]d with an Estimated Date of Delivery: 02/04/22   2) Breech today ECV 01/17/22,    Meds: No orders of the defined types were placed in this encounter.  Labs/procedures today:   Plan:  Continue routine obstetrical care  Next visit: prefers in person    Reviewed: Term labor symptoms and general obstetric precautions including but not limited to vaginal bleeding, contractions, leaking of fluid and fetal movement were reviewed in detail with the patient.  All questions were answered. Has home bp cuff. Rx faxed to . Check bp weekly, let us know if >140/90.   Follow-up: Return in about 1 week (around 01/21/2022) for LROB.  No orders of the defined types were placed in this encounter.   Lazaro Arms, MD 01/14/2022 11:00 AM

## 2022-01-15 ENCOUNTER — Encounter (HOSPITAL_COMMUNITY): Payer: Self-pay | Admitting: Anesthesiology

## 2022-01-17 ENCOUNTER — Other Ambulatory Visit: Payer: Self-pay

## 2022-01-17 ENCOUNTER — Encounter (HOSPITAL_COMMUNITY): Payer: Self-pay

## 2022-01-17 ENCOUNTER — Ambulatory Visit (HOSPITAL_COMMUNITY)
Admission: AD | Admit: 2022-01-17 | Discharge: 2022-01-17 | Disposition: A | Discharge status: Home / Self Care | Payer: BC Managed Care – PPO | Attending: Family Medicine | Admitting: Family Medicine

## 2022-01-17 ENCOUNTER — Observation Stay (HOSPITAL_COMMUNITY)
Admission: RE | Admit: 2022-01-17 | Discharge: 2022-01-17 | Disposition: A | Discharge status: Home / Self Care | Payer: BC Managed Care – PPO | Source: Ambulatory Visit | Attending: Family Medicine | Admitting: Family Medicine

## 2022-01-17 DIAGNOSIS — Z3A37 37 weeks gestation of pregnancy: Secondary | ICD-10-CM

## 2022-01-17 DIAGNOSIS — O321XX0 Maternal care for breech presentation, not applicable or unspecified: Secondary | ICD-10-CM | POA: Diagnosis present

## 2022-01-17 DIAGNOSIS — Z3403 Encounter for supervision of normal first pregnancy, third trimester: Secondary | ICD-10-CM

## 2022-01-17 DIAGNOSIS — Z20822 Contact with and (suspected) exposure to covid-19: Secondary | ICD-10-CM | POA: Diagnosis not present

## 2022-01-17 LAB — TYPE AND SCREEN
ABO/RH(D): O POS
Antibody Screen: NEGATIVE

## 2022-01-17 LAB — CBC
HCT: 33.4 % — ABNORMAL LOW (ref 36.0–46.0)
Hemoglobin: 11.1 g/dL — ABNORMAL LOW (ref 12.0–15.0)
MCH: 27.5 pg (ref 26.0–34.0)
MCHC: 33.2 g/dL (ref 30.0–36.0)
MCV: 82.7 fL (ref 80.0–100.0)
Platelets: 220 10*3/uL (ref 150–400)
RBC: 4.04 MIL/uL (ref 3.87–5.11)
RDW: 13.3 % (ref 11.5–15.5)
WBC: 9.5 10*3/uL (ref 4.0–10.5)
nRBC: 0 % (ref 0.0–0.2)

## 2022-01-17 LAB — RESP PANEL BY RT-PCR (FLU A&B, COVID) ARPGX2
Influenza A by PCR: NEGATIVE
Influenza B by PCR: NEGATIVE
SARS Coronavirus 2 by RT PCR: NEGATIVE

## 2022-01-17 LAB — RPR: RPR Ser Ql: NONREACTIVE

## 2022-01-17 MED ORDER — TERBUTALINE SULFATE 1 MG/ML IJ SOLN
INTRAMUSCULAR | Status: AC
  Filled 2022-01-17: qty 1

## 2022-01-17 MED ORDER — LACTATED RINGERS IV SOLN: INTRAVENOUS | Status: DC

## 2022-01-17 MED ORDER — TERBUTALINE SULFATE 1 MG/ML IJ SOLN
0.2500 mg | Freq: Once | INTRAMUSCULAR | Status: AC
  Administered 2022-01-17: 0.25 mg via SUBCUTANEOUS

## 2022-01-17 NOTE — Procedures (Signed)
ECV note  Reative NST  Predictive score 5-->29.8% success Nulliparity/frank breech/anterior placenta/AFI normal  Back anterior so first changed to back left Then performed forward roll, slowly with good patient tolerance Baby was active throughout 1 attempt was successful  Reactive NST no decels for 1 hour post procedure Samantha Knapp is at [redacted]w[redacted]d Estimated Date of Delivery: 02/04/22  NST being performed due to post ECV  Today the NST is Reactive  Fetal Monitoring:  Baseline: 130s bpm, Variability: Good {> 6 bpm), Accelerations: Reactive, and Decelerations: Absent   reactive  The accelerations are >15 bpm and more than 2 in 20 minutes  Final diagnosis:  Reactive NST  Samantha Arms, MD    Keep scheduled appointment 01/21/22  Samantha Arms, MD 01/17/2022 10:58 AM

## 2022-01-17 NOTE — H&P (Signed)
Samantha Knapp is a 31 y.o. female presenting for ECV  Frank breech    1 Spine anterior    0 Placenta anterior to the left        1 AFI 7 cm                                     2 Nulliparous                                  1  Total score: 5-->SUCCESS PREDICTION:29.8% . OB History     Gravida  1   Para      Term      Preterm      AB      Living         SAB      IAB      Ectopic      Multiple      Live Births             Past Medical History:  Diagnosis Date   DDD (degenerative disc disease), lumbar    Frequent UTI    GERD (gastroesophageal reflux disease)    Migraines    Has improved.   Past Surgical History:  Procedure Laterality Date   NO PAST SURGERIES     TYMPANOSTOMY TUBE PLACEMENT     Family History: family history includes Breast cancer in her paternal grandmother; Cancer in her paternal grandmother; Diabetes in her maternal grandfather, maternal grandmother, and mother; Esophageal cancer in her maternal grandfather; Headache in her brother; Hyperlipidemia in her mother; Lung cancer in her maternal grandmother; Lymphoma in her paternal grandfather; Scoliosis in her brother. Social History:  reports that she has never smoked. She has never used smokeless tobacco. She reports that she does not currently use alcohol. She reports that she does not currently use drugs.   Review of Systems History Exam by:: Dr.Yisel Megill Blood pressure 118/75, pulse 93, temperature 98.8 F (37.1 C), temperature source Oral, resp. rate 20, last menstrual period 04/30/2021, SpO2 100 %. Exam Physical Exam  Prenatal labs: ABO, Rh: --/--/O POS (02/20 0830) Antibody: NEG (02/20 0830) Rubella: 2.62 (09/02 1611) RPR: Non Reactive (12/02 0840)  HBsAg: Negative (09/02 1611)  HIV: Non Reactive (12/02 0840)  GBS: Negative/-- (02/10 1142)   Assessment/Plan: [redacted]w[redacted]d  Frank breech Score 5   Lazaro Arms 01/17/2022, 10:42 AM

## 2022-01-21 ENCOUNTER — Ambulatory Visit (INDEPENDENT_AMBULATORY_CARE_PROVIDER_SITE_OTHER): Payer: BC Managed Care – PPO | Admitting: Advanced Practice Midwife

## 2022-01-21 ENCOUNTER — Other Ambulatory Visit: Payer: Self-pay

## 2022-01-21 VITALS — BP 123/73 | HR 95 | Wt 169.5 lb

## 2022-01-21 DIAGNOSIS — Z3480 Encounter for supervision of other normal pregnancy, unspecified trimester: Secondary | ICD-10-CM

## 2022-01-21 DIAGNOSIS — Z3A38 38 weeks gestation of pregnancy: Secondary | ICD-10-CM

## 2022-01-21 NOTE — Patient Instructions (Signed)
Try these positions: [email or web address]

## 2022-01-21 NOTE — Progress Notes (Signed)
° °  LOW-RISK PREGNANCY VISIT Patient name: Samantha Knapp MRN [id number]  Date of birth: 11/30/1990 Chief Complaint:   Routine Prenatal Visit  History of Present Illness:   Samantha Knapp is a 31 y.o. G1P0 female at [redacted]w[redacted]d with an Estimated Date of Delivery: 02/04/22 being seen today for ongoing management of a low-risk pregnancy.  Today she reports  ready for labor; had ECV on 01/17/22 . Contractions: Irregular. Vag. Bleeding: None.  Movement: Present. denies leaking of fluid. Review of Systems:   Pertinent items are noted in HPI Denies abnormal vaginal discharge w/ itching/odor/irritation, headaches, visual changes, shortness of breath, chest pain, abdominal pain, severe nausea/vomiting, or problems with urination or bowel movements unless otherwise stated above. Pertinent History Reviewed:  Reviewed past medical,surgical, social, obstetrical and family history.  Reviewed problem list, medications and allergies. Physical Assessment:   Vitals:   01/21/22 1025  BP: 123/73  Pulse: 95  Weight: 169 lb 8 oz (76.9 kg)  Body mass index is 28.21 kg/m.        Physical Examination:   General appearance: Well appearing, and in no distress  Mental status: Alert, oriented to person, place, and time  Skin: Warm & dry  Cardiovascular: Normal heart rate noted  Respiratory: Normal respiratory effort, no distress  Abdomen: Soft, gravid, nontender  Pelvic: Cervical exam performed  Dilation: Fingertip Effacement (%): 50 Station: -2  Extremities: Edema: Trace  Fetal Status: Fetal Heart Rate (bpm): 132 Fundal Height: 38 cm Movement: Present Presentation: Vertex  No results found for this or any previous visit (from the past 24 hour(s)).  Assessment & Plan:  1) Low-risk pregnancy G1P0 at [redacted]w[redacted]d with an Estimated Date of Delivery: 02/04/22   2) s/p successful ECV 2/20, still vertex today by exam and bedside u/s   Meds: No orders of the defined types were placed in this  encounter.  Labs/procedures today: SVE, bedside u/s  Plan:  Continue routine obstetrical care   Reviewed: Term labor symptoms and general obstetric precautions including but not limited to vaginal bleeding, contractions, leaking of fluid and fetal movement were reviewed in detail with the patient.  All questions were answered. Has home bp cuff. Check bp weekly, let us know if >140/90.   Follow-up: Return for As scheduled.  No orders of the defined types were placed in this encounter.  Myrtis Ser CNM 01/21/2022 10:50 AM

## 2022-01-23 ENCOUNTER — Encounter (HOSPITAL_COMMUNITY): Payer: Self-pay | Admitting: Obstetrics & Gynecology

## 2022-01-23 ENCOUNTER — Other Ambulatory Visit: Payer: Self-pay

## 2022-01-23 ENCOUNTER — Inpatient Hospital Stay (HOSPITAL_COMMUNITY)
Admission: AD | Admit: 2022-01-23 | Discharge: 2022-01-23 | Disposition: A | Discharge status: Home / Self Care | Payer: BC Managed Care – PPO | Attending: Obstetrics & Gynecology | Admitting: Obstetrics & Gynecology

## 2022-01-23 DIAGNOSIS — O471 False labor at or after 37 completed weeks of gestation: Secondary | ICD-10-CM | POA: Diagnosis not present

## 2022-01-23 DIAGNOSIS — Z3403 Encounter for supervision of normal first pregnancy, third trimester: Secondary | ICD-10-CM

## 2022-01-23 DIAGNOSIS — O479 False labor, unspecified: Secondary | ICD-10-CM | POA: Diagnosis not present

## 2022-01-23 DIAGNOSIS — Z3A38 38 weeks gestation of pregnancy: Secondary | ICD-10-CM | POA: Diagnosis not present

## 2022-01-23 NOTE — MAU Note (Signed)
Presents with ctxs 5 minutes apart for approx 1 hour.  Denies VB or LOF.  Endorses +FM.

## 2022-01-23 NOTE — MAU Provider Note (Signed)
Samantha Knapp is a 31 y.o. G1P0 female at [redacted]w[redacted]d.  RN labor check, not seen by provider.  SVE by RN: Dilation: Fingertip Effacement (%): 70 Station: -2 Exam by:: SWHill, RN  NST: FHR baseline 135 bpm, Variability: moderate, Accelerations: present, Decelerations: Absent= Cat 1/Reactive Toco: Irregular, every 6-11 minutes   Assessment/Plan: -Patient reports contractions every 5 minutes for the last hour prior to arrival -RN SVE unchanged from check in office on 2/24 -Contractions every 6-11 minutes on the monitor  -Cat 1/Reactive NST -False labor -Stable for discharge with return precautions   Worthy Rancher, MD   01/23/2022 7:39 PM

## 2022-01-27 DIAGNOSIS — Z20822 Contact with and (suspected) exposure to covid-19: Secondary | ICD-10-CM | POA: Diagnosis not present

## 2022-01-27 DIAGNOSIS — J0140 Acute pansinusitis, unspecified: Secondary | ICD-10-CM | POA: Diagnosis not present

## 2022-01-27 DIAGNOSIS — Z03818 Encounter for observation for suspected exposure to other biological agents ruled out: Secondary | ICD-10-CM | POA: Diagnosis not present

## 2022-01-28 ENCOUNTER — Encounter: Payer: Self-pay | Admitting: Women's Health

## 2022-01-28 ENCOUNTER — Other Ambulatory Visit: Payer: Self-pay

## 2022-01-28 ENCOUNTER — Ambulatory Visit (INDEPENDENT_AMBULATORY_CARE_PROVIDER_SITE_OTHER): Payer: BC Managed Care – PPO | Admitting: Women's Health

## 2022-01-28 VITALS — BP 124/77 | HR 88 | Wt 171.2 lb

## 2022-01-28 DIAGNOSIS — Z3403 Encounter for supervision of normal first pregnancy, third trimester: Secondary | ICD-10-CM

## 2022-01-28 NOTE — Patient Instructions (Signed)
[place], thank you for choosing our office today! We appreciate the opportunity to meet your healthcare needs. You may receive a short survey by mail, e-mail, or through MyChart. If you are happy with your care we would appreciate if you could take just a few minutes to complete the survey questions. We read all of your comments and take your feedback very seriously. Thank you again for choosing our office.  [facility]  [facility] [place] ([address] [place], [place] [address]) Entrance C, located off of [address] Free 24/7 valet parking   CLASSES: Go to [email or web address] to register for classes (childbirth, breastfeeding, waterbirth, infant CPR, daddy bootcamp, etc.)  Call the office ([phone]) or go to [facility] if: You begin to have strong, frequent contractions Your water breaks.  Sometimes it is a big gush of fluid, sometimes it is just a trickle that keeps getting your panties wet or running down your legs You have vaginal bleeding.  It is normal to have a small amount of spotting if your cervix was checked.  You don't feel your baby moving like normal.  If you don't, get you something to eat and drink and lay down and focus on feeling your baby move.   If your baby is still not moving like normal, you should call the office or go to [facility].  Call the office ([phone]) or go to [facility] for these signs of pre-eclampsia: Severe headache that does not go away with Tylenol Visual changes- seeing spots, double, blurred vision Pain under your right breast or upper abdomen that does not go away with Tums or heartburn medicine Nausea and/or vomiting Severe swelling in your hands, feet, and face   [place] Pediatricians/Family Doctors [facility] ([facility]): [address], [phone]           [facility]: [address], [phone]                 [facility] ([facility]): [address], [phone] (call to ask if accepting patients) [facility]: [address], [place], [phone]    [place] Pediatricians/Family Doctors [facility] ([facility]): [address], [phone] [facility]: [address], [phone] [facility] [place]: [address], [phone]  [place] [facility]  [facility] ([facility]): [phone] [facility]: [address], [phone]   [place] Family Doctors [facility]: [address], [phone]  [place] [facility]  [facility]: [address], [phone]  Home Blood Pressure Monitoring for Patients   Your provider has recommended that you check your blood pressure (BP) at least once a week at home. If you do not have a blood pressure cuff at home, one will be provided for you. Contact your provider if you have not received your monitor within 1 week.   Helpful Tips for Accurate Home Blood Pressure Checks  Don't smoke, exercise, or drink caffeine 30 minutes before checking your BP Use the restroom before checking your BP (a full bladder can raise your pressure) Relax in a comfortable upright chair Feet on the ground Left arm resting comfortably on a flat surface at the level of your heart Legs uncrossed Back supported Sit quietly and don't talk Place the cuff on your bare arm Adjust snuggly, so that only two fingertips   can fit between your skin and the top of the cuff Check 2 readings separated by at least one minute Keep a log of your BP readings For a visual, please reference this diagram: http://ccnc.care/bpdiagram  Provider Name: [facility]     Phone: [phone]  Zone 1: ALL CLEAR  Continue to monitor your symptoms:  BP reading is less than 140 (top number) or less than 90 (bottom number)  No right  upper stomach pain No headaches or seeing spots No feeling nauseated or throwing up No swelling in face and hands  Zone 2: CAUTION Call your doctor's office for any of the following:  BP reading is greater than 140 (top number) or greater than 90 (bottom number)  Stomach pain under your ribs in the middle or right side Headaches or seeing spots Feeling nauseated or throwing up Swelling in face and hands  Zone 3: EMERGENCY  Seek immediate medical care if you have any of the following:  BP reading is greater than160 (top number) or greater than 110 (bottom number) Severe headaches not improving with Tylenol Serious difficulty catching your breath Any worsening symptoms from Zone 2   Braxton Hicks Contractions Contractions of the uterus can occur throughout pregnancy, but they are not always a sign that you are in labor. You may have practice contractions called Braxton Hicks contractions. These false labor contractions are sometimes confused with true labor. What are [place] contractions? Braxton Hicks contractions are tightening movements that occur in the muscles of the uterus before labor. Unlike true labor contractions, these contractions do not result in opening (dilation) and thinning of the cervix. Toward the end of pregnancy (32-34 weeks), Braxton Hicks contractions can happen more often and may become stronger. These contractions are sometimes difficult to tell apart from true labor because they can be very uncomfortable. You should not feel embarrassed if you go to the hospital with false labor. Sometimes, the only way to tell if you are in true labor is for your health care provider to look for changes in the cervix. The health care provider will do a physical exam and may monitor your contractions. If you are not in true labor, the exam should show that your cervix is not dilating and your water has not broken. If there are no other health problems associated with your  pregnancy, it is completely safe for you to be sent home with false labor. You may continue to have Braxton Hicks contractions until you go into true labor. How to tell the difference between true labor and false labor True labor Contractions last 30-70 seconds. Contractions become very regular. Discomfort is usually felt in the top of the uterus, and it spreads to the lower abdomen and low back. Contractions do not go away with walking. Contractions usually become more intense and increase in frequency. The cervix dilates and gets thinner. False labor Contractions are usually shorter and not as strong as true labor contractions. Contractions are usually irregular. Contractions are often felt in the front of the lower abdomen and in the groin. Contractions may go away when you walk around or change positions while lying down. Contractions get weaker and are shorter-lasting as time goes on. The cervix usually does not dilate or become thin. Follow these instructions at home:  Take over-the-counter and prescription medicines only as told by your health care provider. Keep up with your usual exercises and follow other instructions from your health care provider. Eat and drink lightly if you think  you are going into labor. If Braxton Hicks contractions are making you uncomfortable: Change your position from lying down or resting to walking, or change from walking to resting. Sit and rest in a tub of warm water. Drink enough fluid to keep your urine pale yellow. Dehydration may cause these contractions. Do slow and deep breathing several times an hour. Keep all follow-up prenatal visits as told by your health care provider. This is important. Contact a health care provider if: You have a fever. You have continuous pain in your abdomen. Get help right away if: Your contractions become stronger, more regular, and closer together. You have fluid leaking or gushing from your vagina. You pass  blood-tinged mucus (bloody show). You have bleeding from your vagina. You have low back pain that you never had before. You feel your babys head pushing down and causing pelvic pressure. Your baby is not moving inside you as much as it used to. Summary Contractions that occur before labor are called Braxton Hicks contractions, false labor, or practice contractions. Braxton Hicks contractions are usually shorter, weaker, farther apart, and less regular than true labor contractions. True labor contractions usually become progressively stronger and regular, and they become more frequent. Manage discomfort from [facility] contractions by changing position, resting in a warm bath, drinking plenty of water, or practicing deep breathing. This information is not intended to replace advice given to you by your health care provider. Make sure you discuss any questions you have with your health care provider. Document Revised: 10/27/2017 Document Reviewed: 03/30/2017 Elsevier Patient Education  [place].

## 2022-01-28 NOTE — Progress Notes (Signed)
? ? ?LOW-RISK PREGNANCY VISIT ?Patient name: Samantha Knapp MRN [id number]  Date of birth: [birth year] ?Chief Complaint:   ?Routine Prenatal Visit ? ?History of Present Illness:   ?Samantha Knapp is a 31 y.o. G1P0 female at [redacted]w[redacted]d with an Estimated Date of Delivery: 02/04/22 being seen today for ongoing management of a low-risk pregnancy.  ? ?Today she reports  irregular contractions, went to [facility] Sunday night, and contractions stopped. Wants membrane sweep . Contractions: Irritability.  .  Movement: Present. denies leaking of fluid. ? ?Depression screen [facility] 2/9 10/29/2021 07/30/2021 08/05/2020  ?Decreased Interest 0 0 0  ?Down, Depressed, Hopeless 0 0 0  ?PHQ - 2 Score 0 0 0  ?Altered sleeping 1 0 -  ?Tired, decreased energy 1 1 -  ?Change in appetite 0 2 -  ?Feeling bad or failure about yourself  0 0 -  ?Trouble concentrating 0 0 -  ?Moving slowly or fidgety/restless 0 0 -  ?Suicidal thoughts 0 0 -  ?PHQ-9 Score 2 3 -  ? ?  ?GAD 7 : Generalized Anxiety Score 10/29/2021 07/30/2021  ?Nervous, Anxious, on Edge 0 0  ?Control/stop worrying 0 0  ?Worry too much - different things 0 0  ?Trouble relaxing 1 0  ?Restless 0 0  ?Easily annoyed or irritable 0 0  ?Afraid - awful might happen 0 0  ?Total GAD 7 Score 1 0  ? ? ?  ?Review of Systems:   ?Pertinent items are noted in HPI ?Denies abnormal vaginal discharge w/ itching/odor/irritation, headaches, visual changes, shortness of breath, chest pain, abdominal pain, severe nausea/vomiting, or problems with urination or bowel movements unless otherwise stated above. ?Pertinent History Reviewed:  ?Reviewed past medical,surgical, social, obstetrical and family history.  ?Reviewed problem list, medications and allergies. ?Physical Assessment:  ? ?Vitals:  ? 01/28/22 0940  ?BP: 124/77  ?Pulse: 88  ?Weight: 171 lb 3.2 oz (77.7 kg)  ?Body mass index is 28.49 kg/m?. ?  ?     Physical Examination:  ? General appearance: Well appearing, and in no distress ? Mental status: Alert,  oriented to person, place, and time ? Skin: Warm & dry ? Cardiovascular: Normal heart rate noted ? Respiratory: Normal respiratory effort, no distress ? Abdomen: Soft, gravid, nontender ? Pelvic: Cervical exam performed  Dilation: 1 Effacement (%): Thick Station: -3, very posterior, discussed membrane sweep not as likely to work when cx thick, pt wants to try, so membranes swept ? Extremities: Edema: None ? ?Fetal Status: Fetal Heart Rate (bpm): 145 Fundal Height: 37 cm Movement: Present Presentation: Vertex ? ?Chaperone: Levy Pupa   ?No results found for this or any previous visit (from the past 24 hour(s)).  ?Assessment & Plan:  ?1) Low-risk pregnancy G1P0 at [redacted]w[redacted]d with an Estimated Date of Delivery: 02/04/22  ?  ?Meds: No orders of the defined types were placed in this encounter. ? ?Labs/procedures today: SVE and membrane sweep ? ?Plan:  Continue routine obstetrical care  ?Next visit: prefers will be in person for postdates nst    ? ?Reviewed: Term labor symptoms and general obstetric precautions including but not limited to vaginal bleeding, contractions, leaking of fluid and fetal movement were reviewed in detail with the patient.  All questions were answered. Does have home bp cuff. Office bp cuff given: not applicable. Check bp weekly, let us know if consistently >140 and/or >90. ? ?Follow-up: Return for add NST to 3/10 appt. ? ?Future Appointments  ?Date Time Provider [place]  ?02/04/2022  9:10  AM Myrtis Ser, CNM [facility]  ? ? ?No orders of the defined types were placed in this encounter. ? ?Roma Schanz CNM, WHNP-BC ?01/28/2022 ?10:21 AM  ?

## 2022-01-30 ENCOUNTER — Other Ambulatory Visit: Payer: Self-pay

## 2022-01-30 ENCOUNTER — Ambulatory Visit
Admission: EM | Admit: 2022-01-30 | Discharge: 2022-01-30 | Disposition: A | Discharge status: Home / Self Care | Payer: BC Managed Care – PPO | Attending: Urgent Care | Admitting: Urgent Care

## 2022-01-30 ENCOUNTER — Encounter: Payer: Self-pay | Admitting: Emergency Medicine

## 2022-01-30 DIAGNOSIS — J018 Other acute sinusitis: Secondary | ICD-10-CM

## 2022-01-30 DIAGNOSIS — R0982 Postnasal drip: Secondary | ICD-10-CM

## 2022-01-30 DIAGNOSIS — H9202 Otalgia, left ear: Secondary | ICD-10-CM

## 2022-01-30 DIAGNOSIS — R052 Subacute cough: Secondary | ICD-10-CM

## 2022-01-30 MED ORDER — FLUTICASONE PROPIONATE 50 MCG/ACT NA SUSP: 2.0000 | Freq: Every day | NASAL | 0 refills | Status: AC

## 2022-01-30 MED ORDER — PSEUDOEPHEDRINE HCL 60 MG PO TABS: 60.0000 mg | ORAL_TABLET | Freq: Three times a day (TID) | ORAL | 0 refills | Status: DC | PRN

## 2022-01-30 NOTE — ED Provider Notes (Signed)
?[facility] ? ? ?MRN: [id number] DOB: 1991-11-10 ? ?Subjective:  ? ?Samantha Knapp is a 31 y.o. female presenting for 1 day history of decreased hearing, left ear pain.  She has also had a runny and stuffy nose and a cough.  She is actually pregnant now.  Was seen on Thursday and started on amoxicillin for sinusitis.  Has been using Mucinex as well.  No chest pain, shortness of breath or wheezing. ? ?No current facility-administered medications for this encounter. ? ?Current Outpatient Medications:  ?  amoxicillin (AMOXIL) 250 MG capsule, Take 250 mg by mouth 2 (two) times daily., Disp: , Rfl:  ?  Blood Pressure Monitor MISC, For regular home bp monitoring during pregnancy, Disp: 1 each, Rfl: 0 ?  omeprazole (PRILOSEC) 20 MG capsule, Take 1 capsule (20 mg total) by mouth daily. 1 tablet a day, Disp: 30 capsule, Rfl: 6 ?  Pediatric Multivit-Minerals-C (FLINTSTONES GUMMIES) chewable tablet, Chew 2 tablets by mouth at bedtime., Disp: , Rfl:   ? ?No Known Allergies ? ?Past Medical History:  ?Diagnosis Date  ? DDD (degenerative disc disease), lumbar   ? Frequent UTI   ? GERD (gastroesophageal reflux disease)   ? Migraines   ? Has improved.  ?  ? ?Past Surgical History:  ?Procedure Laterality Date  ? NO PAST SURGERIES    ? TYMPANOSTOMY TUBE PLACEMENT    ? ? ?Family History  ?Problem Relation Age of Onset  ? Diabetes Mother   ? Hyperlipidemia Mother   ? Scoliosis Brother   ? Headache Brother   ? Diabetes Maternal Grandmother   ? Lung cancer Maternal Grandmother   ? Diabetes Maternal Grandfather   ? Esophageal cancer Maternal Grandfather   ? Cancer Paternal Grandmother   ? Breast cancer Paternal Grandmother   ? Lymphoma Paternal Grandfather   ? ? ?Social History  ? ?Tobacco Use  ? Smoking status: Never  ? Smokeless tobacco: Never  ?Vaping Use  ? Vaping Use: Never used  ?Substance Use Topics  ? Alcohol use: Not Currently  ?  Comment: occ  ? Drug use: Not Currently  ? ? ?ROS ? ? ?Objective:   ? ?Vitals: ?BP 116/67 (BP Location: Right Arm)   Pulse 80   Temp 98.8 ?F (37.1 ?C) (Oral)   Resp 18   LMP 04/30/2021   SpO2 95%  ? ?Physical Exam ?Constitutional:   ?   General: She is not in acute distress. ?   Appearance: Normal appearance. She is well-developed and normal weight. She is not ill-appearing, toxic-appearing or diaphoretic.  ?HENT:  ?   Head: Normocephalic and atraumatic.  ?   Right Ear: Tympanic membrane, ear canal and external ear normal. No drainage or tenderness. No middle ear effusion. There is no impacted cerumen. Tympanic membrane is not erythematous.  ?   Left Ear: Tympanic membrane, ear canal and external ear normal. No drainage or tenderness.  No middle ear effusion. There is no impacted cerumen. Tympanic membrane is not erythematous.  ?   Nose: Congestion and rhinorrhea present.  ?   Mouth/Throat:  ?   Mouth: Mucous membranes are moist. No oral lesions.  ?   Pharynx: No pharyngeal swelling, oropharyngeal exudate, posterior oropharyngeal erythema or uvula swelling.  ?   Tonsils: No tonsillar exudate or tonsillar abscesses.  ?   Comments: Thick postnasal drainage overlying pharynx. ?Eyes:  ?   General: No scleral icterus.    ?   Right eye: No discharge.     ?  Left eye: No discharge.  ?   Extraocular Movements: Extraocular movements intact.  ?   Right eye: Normal extraocular motion.  ?   Left eye: Normal extraocular motion.  ?   Conjunctiva/sclera: Conjunctivae normal.  ?Cardiovascular:  ?   Rate and Rhythm: Normal rate.  ?Pulmonary:  ?   Effort: Pulmonary effort is normal.  ?Musculoskeletal:  ?   Cervical back: Normal range of motion and neck supple.  ?Lymphadenopathy:  ?   Cervical: No cervical adenopathy.  ?Skin: ?   General: Skin is warm and dry.  ?Neurological:  ?   General: No focal deficit present.  ?   Mental Status: She is alert and oriented to person, place, and time.  ?Psychiatric:     ?   Mood and Affect: Mood normal.     ?   Behavior: Behavior normal.  ? ? ?Assessment and  Plan :  ? ?PDMP not reviewed this encounter. ? ?1. Acute non-recurrent sinusitis of other sinus   ?2. Left ear pain   ?3. Post-nasal drainage   ?4. Subacute cough   ? ?Unremarkable ENT exam.  Continue with treatment for sinusitis with amoxicillin.  I will add Flonase and pseudoephedrine.  Check back with OB/GYN.  Recommend supportive care otherwise.  Counseled patient on potential for adverse effects with medications prescribed/recommended today, ER and return-to-clinic precautions discussed, patient verbalized understanding. ? ?  ?Wallis Bamberg, PA-C ?01/30/22 1257 ? ?

## 2022-01-30 NOTE — ED Triage Notes (Signed)
Left ear pain since yesterday.  States she can't hear out of that ear.  States she does have some nasal congestion. ?

## 2022-02-02 ENCOUNTER — Encounter (HOSPITAL_COMMUNITY): Payer: Self-pay | Admitting: Obstetrics and Gynecology

## 2022-02-02 ENCOUNTER — Other Ambulatory Visit: Payer: Self-pay

## 2022-02-02 ENCOUNTER — Inpatient Hospital Stay (EMERGENCY_DEPARTMENT_HOSPITAL)
Admission: AD | Admit: 2022-02-02 | Discharge: 2022-02-02 | Disposition: A | Discharge status: Home / Self Care | Payer: BC Managed Care – PPO | Source: Home / Self Care | Attending: Obstetrics and Gynecology | Admitting: Obstetrics and Gynecology

## 2022-02-02 DIAGNOSIS — Z23 Encounter for immunization: Secondary | ICD-10-CM | POA: Diagnosis not present

## 2022-02-02 DIAGNOSIS — O479 False labor, unspecified: Secondary | ICD-10-CM | POA: Diagnosis not present

## 2022-02-02 DIAGNOSIS — Z20822 Contact with and (suspected) exposure to covid-19: Secondary | ICD-10-CM | POA: Diagnosis not present

## 2022-02-02 DIAGNOSIS — R030 Elevated blood-pressure reading, without diagnosis of hypertension: Secondary | ICD-10-CM | POA: Diagnosis not present

## 2022-02-02 DIAGNOSIS — O471 False labor at or after 37 completed weeks of gestation: Secondary | ICD-10-CM | POA: Insufficient documentation

## 2022-02-02 DIAGNOSIS — Z3A40 40 weeks gestation of pregnancy: Secondary | ICD-10-CM | POA: Diagnosis not present

## 2022-02-02 DIAGNOSIS — Z30430 Encounter for insertion of intrauterine contraceptive device: Secondary | ICD-10-CM | POA: Diagnosis not present

## 2022-02-02 DIAGNOSIS — O134 Gestational [pregnancy-induced] hypertension without significant proteinuria, complicating childbirth: Secondary | ICD-10-CM | POA: Diagnosis not present

## 2022-02-02 DIAGNOSIS — O480 Post-term pregnancy: Secondary | ICD-10-CM | POA: Diagnosis not present

## 2022-02-02 DIAGNOSIS — Z3403 Encounter for supervision of normal first pregnancy, third trimester: Secondary | ICD-10-CM

## 2022-02-02 DIAGNOSIS — Z3A39 39 weeks gestation of pregnancy: Secondary | ICD-10-CM | POA: Insufficient documentation

## 2022-02-02 NOTE — MAU Note (Signed)
Samantha Knapp is a 31 y.o. at [redacted]w[redacted]d here in MAU reporting: contractions have been going on for week, states they got a little bit stronger yesterday. Pt reports she is unsure if it is contractions or FM. States pains are irregular. Denies bleeding or LOF, did lose her mucus plug. ? ?Onset of complaint: ongoing ? ?Pain score: 7/10 ? ?FHT:+FM ? ?Lab orders placed from triage: none ? ?

## 2022-02-02 NOTE — MAU Provider Note (Signed)
S: Patient is here for RN labor evaluation. Fetal tracing, vital signs, & chart reviewed  ? ?O:  ?Vitals:  ? 02/02/22 1331 02/02/22 1335 02/02/22 1340 02/02/22 1345  ?BP: 119/82     ?Pulse: 90     ?Resp:      ?Temp:      ?TempSrc:      ?SpO2:  98% 98% 98%  ?Weight:      ?Height:      ? ?No results found for this or any previous visit (from the past 24 hour(s)). ? ?Dilation: Fingertip ?Effacement (%): Thick ?Cervical Position: Posterior ?Station: -2 ?Presentation: Vertex ?Exam by:: weston,rn ? ? ?FHR: 140 bpm, Mod Var, no Decels, more than 3 15x15  Accels during 1. ?5 hours of monitoring ?UC: irregular, ~every 15 mins ? ? ?A: ?1. Encounter for supervision of normal first pregnancy in third trimester   ?2. False labor   ? ?Cervix unchanged after 1 hr recheck ? ?P:  ?RN to discharge home in stable condition with return precautions & fetal kick counts ? ?Warner Mccreedy, MD ?@T @ ?2:06 PM ? ?

## 2022-02-04 ENCOUNTER — Ambulatory Visit (INDEPENDENT_AMBULATORY_CARE_PROVIDER_SITE_OTHER): Payer: BC Managed Care – PPO | Admitting: Advanced Practice Midwife

## 2022-02-04 ENCOUNTER — Encounter (HOSPITAL_COMMUNITY): Payer: Self-pay | Admitting: *Deleted

## 2022-02-04 ENCOUNTER — Telehealth (HOSPITAL_COMMUNITY): Payer: Self-pay | Admitting: *Deleted

## 2022-02-04 ENCOUNTER — Other Ambulatory Visit: Payer: Self-pay

## 2022-02-04 VITALS — BP 138/85 | HR 84 | Wt 172.8 lb

## 2022-02-04 DIAGNOSIS — Z3A40 40 weeks gestation of pregnancy: Secondary | ICD-10-CM

## 2022-02-04 DIAGNOSIS — Z3480 Encounter for supervision of other normal pregnancy, unspecified trimester: Secondary | ICD-10-CM

## 2022-02-04 NOTE — Progress Notes (Signed)
? ?  LOW-RISK PREGNANCY VISIT ?Patient name: Samantha Knapp MRN [id number]  Date of birth: [birth year] ?Chief Complaint:   ?Routine Prenatal Visit ? ?History of Present Illness:   ?Samantha Knapp is a 31 y.o. G1P0 female at [redacted]w[redacted]d with an Estimated Date of Delivery: 02/04/22 being seen today for ongoing management of a low-risk pregnancy.  ?Today she reports  being tx for sinusitis; ready for labor! . Contractions: Irritability. Vag. Bleeding: None.  Movement: Present. denies leaking of fluid. ?Review of Systems:   ?Pertinent items are noted in HPI ?Denies abnormal vaginal discharge w/ itching/odor/irritation, headaches, visual changes, shortness of breath, chest pain, abdominal pain, severe nausea/vomiting, or problems with urination or bowel movements unless otherwise stated above. ?Pertinent History Reviewed:  ?Reviewed past medical,surgical, social, obstetrical and family history.  ?Reviewed problem list, medications and allergies. ?Physical Assessment:  ? ?Vitals:  ? 02/04/22 0917  ?BP: 138/85  ?Pulse: 84  ?Weight: 172 lb 12.8 oz (78.4 kg)  ?Body mass index is 28.76 kg/m?. ?  ?     Physical Examination:  ? General appearance: Well appearing, and in no distress ? Mental status: Alert, oriented to person, place, and time ? Skin: Warm & dry ? Cardiovascular: Normal heart rate noted ? Respiratory: Normal respiratory effort, no distress ? Abdomen: Soft, gravid, nontender ? Pelvic: Cervical exam performed  Dilation: 1 Effacement (%): 50 Station: -2 ? Extremities: Edema: None ? ?Fetal Status: Fetal Heart Rate (bpm): 141 Fundal Height: 38 cm Movement: Present Presentation: Vertex ? ?No results found for this or any previous visit (from the past 24 hour(s)).  ?Assessment & Plan:  ?1) Low-risk pregnancy G1P0 at [redacted]w[redacted]d with an Estimated Date of Delivery: 02/04/22  ? ?2) Preg @ 40.0wks, will get NST at 40.3 and cx check (may not want membrane sweep);  IOL form faxed via Epic and orders placed for March 16th AM;  given info on [place] ?  ?Meds: No orders of the defined types were placed in this encounter. ? ?Labs/procedures today: membrane sweep/SVE ? ?Plan:  Continue routine obstetrical care  ? ?Reviewed: Term labor symptoms and general obstetric precautions including but not limited to vaginal bleeding, contractions, leaking of fluid and fetal movement were reviewed in detail with the patient.  All questions were answered. Has home bp cuff.  Check bp weekly, let us know if >140/90.  ? ?Follow-up: Return in 3 days (on 02/07/2022) for NST 3/13 postdates, LROB. ? ?No orders of the defined types were placed in this encounter. ? ?Myrtis Ser CNM ?02/04/2022 ?10:08 AM  ?

## 2022-02-04 NOTE — Telephone Encounter (Signed)
Preadmission screen  

## 2022-02-04 NOTE — Patient Instructions (Addendum)
Try the positions at [email or web address] ? ? ?Your induction is scheduled for February 10, 2022. Please DO NOT show up at the time you see in MyChart. Someone from Labor & Delivery will call you on the date of your induction to let you know what time to come in. Please keep your phone on and with you at all times, you have 1 hour to respond to them to let them know you are on your way.  Go to the main desk at the [facility] and let them know you are there to be induced. They will send someone from Labor & Delivery to come get you.  ?You will get a call from a nurse from the hospital within the next day or so to go over some information, she will also schedule you to go to the hospital a few days before you induction to have your Covid test. If you have any questions, please let us know.   ?

## 2022-02-05 ENCOUNTER — Inpatient Hospital Stay (HOSPITAL_COMMUNITY)
Admission: AD | Admit: 2022-02-05 | Discharge: 2022-02-08 | DRG: 807 | Disposition: A | Discharge status: Home / Self Care | Payer: BC Managed Care – PPO | Attending: Obstetrics & Gynecology | Admitting: Obstetrics & Gynecology

## 2022-02-05 ENCOUNTER — Other Ambulatory Visit: Payer: Self-pay

## 2022-02-05 ENCOUNTER — Encounter (HOSPITAL_COMMUNITY): Payer: Self-pay | Admitting: Family Medicine

## 2022-02-05 DIAGNOSIS — Z3A40 40 weeks gestation of pregnancy: Secondary | ICD-10-CM | POA: Diagnosis not present

## 2022-02-05 DIAGNOSIS — Z30430 Encounter for insertion of intrauterine contraceptive device: Secondary | ICD-10-CM | POA: Diagnosis not present

## 2022-02-05 DIAGNOSIS — Z20822 Contact with and (suspected) exposure to covid-19: Secondary | ICD-10-CM | POA: Diagnosis present

## 2022-02-05 DIAGNOSIS — O134 Gestational [pregnancy-induced] hypertension without significant proteinuria, complicating childbirth: Secondary | ICD-10-CM | POA: Diagnosis present

## 2022-02-05 DIAGNOSIS — O480 Post-term pregnancy: Secondary | ICD-10-CM | POA: Diagnosis present

## 2022-02-05 DIAGNOSIS — R030 Elevated blood-pressure reading, without diagnosis of hypertension: Secondary | ICD-10-CM | POA: Diagnosis present

## 2022-02-05 DIAGNOSIS — O139 Gestational [pregnancy-induced] hypertension without significant proteinuria, unspecified trimester: Secondary | ICD-10-CM

## 2022-02-05 DIAGNOSIS — Z3403 Encounter for supervision of normal first pregnancy, third trimester: Principal | ICD-10-CM

## 2022-02-05 DIAGNOSIS — R87810 Cervical high risk human papillomavirus (HPV) DNA test positive: Secondary | ICD-10-CM | POA: Diagnosis present

## 2022-02-05 LAB — RESP PANEL BY RT-PCR (FLU A&B, COVID) ARPGX2
Influenza A by PCR: NEGATIVE
Influenza B by PCR: NEGATIVE
SARS Coronavirus 2 by RT PCR: NEGATIVE

## 2022-02-05 LAB — URINALYSIS, ROUTINE W REFLEX MICROSCOPIC
Bacteria, UA: NONE SEEN
Bilirubin Urine: NEGATIVE
Glucose, UA: 50 mg/dL — AB
Ketones, ur: NEGATIVE mg/dL
Nitrite: NEGATIVE
Protein, ur: 30 mg/dL — AB
Specific Gravity, Urine: 1.02 (ref 1.005–1.030)
pH: 6 (ref 5.0–8.0)

## 2022-02-05 LAB — COMPREHENSIVE METABOLIC PANEL
ALT: 19 U/L (ref 0–44)
AST: 22 U/L (ref 15–41)
Albumin: 2.7 g/dL — ABNORMAL LOW (ref 3.5–5.0)
Alkaline Phosphatase: 182 U/L — ABNORMAL HIGH (ref 38–126)
Anion gap: 9 (ref 5–15)
BUN: 9 mg/dL (ref 6–20)
CO2: 21 mmol/L — ABNORMAL LOW (ref 22–32)
Calcium: 8.6 mg/dL — ABNORMAL LOW (ref 8.9–10.3)
Chloride: 105 mmol/L (ref 98–111)
Creatinine, Ser: 0.6 mg/dL (ref 0.44–1.00)
GFR, Estimated: >60 mL/min (ref >60)
Glucose, Bld: 100 mg/dL — ABNORMAL HIGH (ref 70–99)
Potassium: 3.4 mmol/L — ABNORMAL LOW (ref 3.5–5.1)
Sodium: 135 mmol/L (ref 135–145)
Total Bilirubin: 0.4 mg/dL (ref 0.3–1.2)
Total Protein: 6.4 g/dL — ABNORMAL LOW (ref 6.5–8.1)

## 2022-02-05 LAB — PROTEIN / CREATININE RATIO, URINE
Creatinine, Urine: 149.98 mg/dL
Protein Creatinine Ratio: 0.28 mg/mg{Cre} — ABNORMAL HIGH (ref 0.00–0.15)
Total Protein, Urine: 42 mg/dL

## 2022-02-05 LAB — CBC
HCT: 33.1 % — ABNORMAL LOW (ref 36.0–46.0)
Hemoglobin: 10.6 g/dL — ABNORMAL LOW (ref 12.0–15.0)
MCH: 27 pg (ref 26.0–34.0)
MCHC: 32 g/dL (ref 30.0–36.0)
MCV: 84.2 fL (ref 80.0–100.0)
Platelets: 259 10*3/uL (ref 150–400)
RBC: 3.93 MIL/uL (ref 3.87–5.11)
RDW: 13.6 % (ref 11.5–15.5)
WBC: 10.3 10*3/uL (ref 4.0–10.5)
nRBC: 0 % (ref 0.0–0.2)

## 2022-02-05 LAB — TYPE AND SCREEN
ABO/RH(D): O POS
Antibody Screen: NEGATIVE

## 2022-02-05 MED ORDER — ACETAMINOPHEN 325 MG PO TABS
650.0000 mg | ORAL_TABLET | ORAL | Status: DC | PRN
  Administered 2022-02-07: 650 mg via ORAL
  Filled 2022-02-05: qty 2

## 2022-02-05 MED ORDER — FENTANYL CITRATE (PF) 100 MCG/2ML IJ SOLN
100.0000 ug | INTRAMUSCULAR | Status: DC | PRN
  Administered 2022-02-06 (×2): 100 ug via INTRAVENOUS
  Filled 2022-02-05 (×2): qty 2

## 2022-02-05 MED ORDER — LACTATED RINGERS IV SOLN: INTRAVENOUS | Status: DC

## 2022-02-05 MED ORDER — OXYTOCIN BOLUS FROM INFUSION
333.0000 mL | Freq: Once | INTRAVENOUS | Status: AC
  Administered 2022-02-07: 333 mL via INTRAVENOUS

## 2022-02-05 MED ORDER — LACTATED RINGERS IV SOLN: 500.0000 mL | INTRAVENOUS | Status: DC | PRN

## 2022-02-05 MED ORDER — OXYCODONE-ACETAMINOPHEN 5-325 MG PO TABS: 2.0000 | ORAL_TABLET | ORAL | Status: DC | PRN

## 2022-02-05 MED ORDER — LIDOCAINE HCL (PF) 1 % IJ SOLN
30.0000 mL | INTRAMUSCULAR | Status: DC | PRN
Start: 2022-02-05 — End: 2022-02-07

## 2022-02-05 MED ORDER — OXYCODONE-ACETAMINOPHEN 5-325 MG PO TABS: 1.0000 | ORAL_TABLET | ORAL | Status: DC | PRN

## 2022-02-05 MED ORDER — SOD CITRATE-CITRIC ACID 500-334 MG/5ML PO SOLN
30.0000 mL | ORAL | Status: DC | PRN
  Administered 2022-02-05: 30 mL via ORAL
  Filled 2022-02-05: qty 30

## 2022-02-05 MED ORDER — TERBUTALINE SULFATE 1 MG/ML IJ SOLN: 0.2500 mg | Freq: Once | INTRAMUSCULAR | Status: DC | PRN

## 2022-02-05 MED ORDER — ONDANSETRON HCL 4 MG/2ML IJ SOLN
4.0000 mg | Freq: Four times a day (QID) | INTRAMUSCULAR | Status: DC | PRN
  Administered 2022-02-06 – 2022-02-07 (×3): 4 mg via INTRAVENOUS
  Filled 2022-02-05 (×3): qty 2

## 2022-02-05 MED ORDER — MISOPROSTOL 25 MCG QUARTER TABLET
25.0000 ug | ORAL_TABLET | ORAL | Status: DC | PRN
  Administered 2022-02-05 – 2022-02-06 (×2): 25 ug via VAGINAL
  Filled 2022-02-05 (×2): qty 1

## 2022-02-05 MED ORDER — OXYTOCIN-SODIUM CHLORIDE 30-0.9 UT/500ML-% IV SOLN
2.5000 [IU]/h | INTRAVENOUS | Status: DC
  Filled 2022-02-05: qty 500

## 2022-02-05 NOTE — MAU Provider Note (Signed)
MSE done ?Dr. Annia Friendly placed admission orders while patient was in MAU.  ?Patient denies HA or vision changes.  ? ?Vitals:  ? 02/05/22 2131 02/05/22 2151  ?BP: (!) 148/85 (!) 142/87  ?Pulse: 79 76  ?Temp: 98.1 ?F (36.7 ?C)   ?Resp: 18   ?Height: 5\' 5"  (1.651 m)   ?Weight: 80.4 kg   ?SpO2: 100% 100%  ?TempSrc: Oral   ?BMI (Calculated): 29.5   ?  ? ?GENERAL: Well-developed, well-nourished female in no acute distress.  ?LUNGS: Effort normal ?SKIN: Warm, dry and without erythema ?PSYCH: Normal mood and affect  ?Normal lower extremity,  refluxes, no clonus.  ? ? ?1. Encounter for supervision of normal first pregnancy in third trimester   ?2. Elevated BP without diagnosis of hypertension   ?3. [redacted] weeks gestation of pregnancy   ?  ? ?Admit to labor.  ?Category 1 fetal tracing. ? ? ? , NP ?02/05/2022 ?10:24 PM ? ?

## 2022-02-05 NOTE — H&P (Signed)
OBSTETRIC ADMISSION HISTORY AND PHYSICAL ? ?Samantha Knapp is a 31 y.o. female G1P0 with IUP at [redacted]w[redacted]d by 7 week [place] presenting for IOL due to new onset elevated blood pressures at term. She reports +FMs, No LOF, no VB, no blurry vision, or current HA. She has noticed more LE edema bilaterally. She also complained of some constant RUQ pain since this afternoon at 4 pm. She plans on pumping breast milk for feeding. She request an IUD for birth control. ?She received her prenatal care at [facility]  ? ?Dating: By 7 week [place] --->  Estimated Date of Delivery: 02/04/22 ? ? ?Prenatal History/Complications:  ?--[name] breech, underwent ECV on 2/20 which was successful  ?--HPV positive with negative pap cytology  ? ?Past Medical History: ?Past Medical History:  ?Diagnosis Date  ? DDD (degenerative disc disease), lumbar   ? Frequent UTI   ? GERD (gastroesophageal reflux disease)   ? Migraines   ? Has improved.  ? ? ?Past Surgical History: ?Past Surgical History:  ?Procedure Laterality Date  ? NO PAST SURGERIES    ? TYMPANOSTOMY TUBE PLACEMENT    ? ? ?Obstetrical History: ?OB History   ? ? Gravida  ?1  ? Para  ?   ? Term  ?   ? Preterm  ?   ? AB  ?   ? Living  ?   ?  ? ? SAB  ?   ? IAB  ?   ? Ectopic  ?   ? Multiple  ?   ? Live Births  ?   ?   ?  ?  ? ? ?Social History ?Social History  ? ?Socioeconomic History  ? Marital status: Married  ?  Spouse name: Not on file  ? Number of children: Not on file  ? Years of education: Not on file  ? Highest education level: Not on file  ?Occupational History  ? Not on file  ?Tobacco Use  ? Smoking status: Never  ? Smokeless tobacco: Never  ?Vaping Use  ? Vaping Use: Never used  ?Substance and Sexual Activity  ? Alcohol use: Not Currently  ?  Comment: occ  ? Drug use: Not Currently  ? Sexual activity: Yes  ?  Birth control/protection: None  ?Other Topics Concern  ? Not on file  ?Social History Narrative  ? Not on file  ? ?Social Determinants of Health  ? ?Financial Resource Strain:  Low Risk   ? Difficulty of Paying Living Expenses: Not very hard  ?Food Insecurity: No Food Insecurity  ? Worried About Programme researcher, broadcasting/film/video in the Last Year: Never true  ? Ran Out of Food in the Last Year: Never true  ?Transportation Needs: No Transportation Needs  ? Lack of Transportation (Medical): No  ? Lack of Transportation (Non-Medical): No  ?Physical Activity: Insufficiently Active  ? Days of Exercise per Week: 2 days  ? Minutes of Exercise per Session: 20 min  ?Stress: No Stress Concern Present  ? Feeling of Stress : Not at all  ?Social Connections: Moderately Integrated  ? Frequency of Communication with Friends and Family: More than three times a week  ? Frequency of Social Gatherings with Friends and Family: More than three times a week  ? Attends Religious Services: More than 4 times per year  ? Active Member of Clubs or Organizations: No  ? Attends Banker Meetings: Never  ? Marital Status: Married  ? ? ?Family History: ?Family History  ?Problem  Relation Age of Onset  ? Diabetes Mother   ? Hyperlipidemia Mother   ? Scoliosis Brother   ? Headache Brother   ? Diabetes Maternal Grandmother   ? Lung cancer Maternal Grandmother   ? Diabetes Maternal Grandfather   ? Esophageal cancer Maternal Grandfather   ? Cancer Paternal Grandmother   ? Breast cancer Paternal Grandmother   ? Lymphoma Paternal Grandfather   ? ? ?Allergies: ?No Known Allergies ? ?Medications Prior to Admission  ?Medication Sig Dispense Refill Last Dose  ? Blood Pressure Monitor MISC For regular home bp monitoring during pregnancy 1 each 0 02/05/2022  ? fluticasone (FLONASE) 50 MCG/ACT nasal spray Place 2 sprays into both nostrils daily. 16 g 0 Past Week  ? omeprazole (PRILOSEC) 20 MG capsule Take 1 capsule (20 mg total) by mouth daily. 1 tablet a day 30 capsule 6 Past Month  ? amoxicillin (AMOXIL) 875 MG tablet Take 875 mg by mouth 2 (two) times daily. (Patient not taking: Reported on 02/04/2022)     ? Pediatric  Multivit-Minerals-C (FLINTSTONES GUMMIES) chewable tablet Chew 2 tablets by mouth at bedtime. (Patient not taking: Reported on 02/04/2022)   More than a month  ? pseudoephedrine (SUDAFED) 60 MG tablet Take 1 tablet (60 mg total) by mouth every 8 (eight) hours as needed for congestion. (Patient not taking: Reported on 02/04/2022) 30 tablet 0   ? ? ? ?Review of Systems  ? ?All systems reviewed and negative except as stated in HPI ? ?Blood pressure (!) 142/87, pulse 76, temperature 98.1 ?F (36.7 ?C), temperature source Oral, resp. rate 18, height 5\' 5"  (1.651 m), weight 80.4 kg, last menstrual period 04/30/2021, SpO2 100 %. ?General appearance: alert, cooperative, and appears stated age ?Lungs: Normal WOB  ?Heart: regular rate  ?Abdomen: soft, non-tender ?Pelvic: NEFG ?Extremities: Homans sign is negative, no sign of DVT ?Presentation: cephalic ?Fetal monitoringBaseline: 125 bpm, Variability: Good {> 6 bpm), Accelerations: Reactive, and Decelerations: Absent ?Uterine activity occasional  ?  ?Dilation: 1.5 ?Effacement (%): Thick ?Cervical Position: Posterior ?Station: -3 ?Presentation: Vertex ?Exam by:: L.Mears,Rn  ? ?Prenatal labs: ?ABO, Rh: --/--/O POS (02/20 0830) ?Antibody: NEG (02/20 0830) ?Rubella: 2.62 (09/02 1611) ?RPR: NON REACTIVE (02/20 0816)  ?HBsAg: Negative (09/02 1611)  ?HIV: Non Reactive (12/02 0840)  ?GBS: Negative/-- (02/10 1142)  ?2 hr Glucola passed ?Genetic screening declined  ?Anatomy 01-03-2001 Normal female  ? ?Prenatal Transfer Tool  ?Maternal Diabetes: No ?Genetic Screening: Declined ?Maternal Ultrasounds/Referrals: Normal ?Fetal Ultrasounds or other Referrals:  None ?Maternal Substance Abuse:  No ?Significant Maternal Medications:  None ?Significant Maternal Lab Results: Group B Strep negative ? ?Results for orders placed or performed during the hospital encounter of 02/05/22 (from the past 24 hour(s))  ?Urinalysis, Routine w reflex microscopic Urine, Clean Catch  ? Collection Time: 02/05/22  9:42 PM   ?Result Value Ref Range  ? Color, Urine YELLOW YELLOW  ? APPearance CLEAR CLEAR  ? Specific Gravity, Urine 1.020 1.005 - 1.030  ? pH 6.0 5.0 - 8.0  ? Glucose, UA 50 (A) NEGATIVE mg/dL  ? Hgb urine dipstick SMALL (A) NEGATIVE  ? Bilirubin Urine NEGATIVE NEGATIVE  ? Ketones, ur NEGATIVE NEGATIVE mg/dL  ? Protein, ur 30 (A) NEGATIVE mg/dL  ? Nitrite NEGATIVE NEGATIVE  ? Leukocytes,Ua TRACE (A) NEGATIVE  ? RBC / HPF 0-5 0 - 5 RBC/hpf  ? WBC, UA 0-5 0 - 5 WBC/hpf  ? Bacteria, UA NONE SEEN NONE SEEN  ? Squamous Epithelial / LPF 11-20 0 - 5  ?  Mucus PRESENT   ?Protein / creatinine ratio, urine  ? Collection Time: 02/05/22  9:42 PM  ?Result Value Ref Range  ? Creatinine, Urine 149.98 mg/dL  ? Total Protein, Urine 42 mg/dL  ? Protein Creatinine Ratio 0.28 (H) 0.00 - 0.15 mg/mg[Cre]  ? ? ?Patient Active Problem List  ? Diagnosis Date Noted  ? Post term pregnancy over 40 weeks 02/05/2022  ? Cervical high risk HPV (human papillomavirus) test positive 08/12/2021  ? Supervision of normal first pregnancy 07/30/2021  ? Chronic low back pain 08/14/2012  ? ? ?Assessment/Plan:  ?Raechel ChuteDanielle E Taitano is a 31 y.o. G1P0 at 4078w1d here for IOL due to new onset elevated blood pressures at term.  ? ?#Labor: IOL with cytotec x 1 given. Foley balloon discussed but patient would like to hold off for now, can consider at further checks. ?#Pain: PRN  ?#FWB: Cat I  ?#ID: GBS Negative  ?#MOF: Breast milk via pump  ?#MOC: IUD  ?#Circ: Yes, inpatient  ? ?#Elevated blood pressures without hypertensive diagnosis: SBP 140's on arrival. Currently asymptomatic, although intermittent RUQ pain since this afternoon at 4 pm. Pre-e labs ordered and WNL (including liver function). P/Cr 0.28. Will monitor closely.  ? ?#Previous breech s/p successful ECV: Cephalic on check and via leopolds. Monitor closely and low threshold for bedside US during IOL.  ? ?Allayne StackSamantha N Antoria Lanza, DO  ?02/05/2022, 10:20 PM ? ?  ?

## 2022-02-05 NOTE — MAU Note (Signed)
Samantha Knapp is a 31 y.o. at [redacted]w[redacted]d here in MAU reporting: Leg swelling that improves with elevation and increasing BP. No visual changes, headache this morning that went away. RUQ pain off and on for weeks.  ?LMP:  ?Onset of complaint: 02/05/22 ?Pain score: 4/10 ?Vitals:  ? 02/05/22 2131  ?BP: (!) 148/85  ?Pulse: 79  ?Resp: 18  ?Temp: 98.1 ?F (36.7 ?C)  ?SpO2: 100%  ?   ?FHT:130 ? ?Lab orders placed from triage:  ?Urinalysis ?

## 2022-02-06 ENCOUNTER — Inpatient Hospital Stay (HOSPITAL_COMMUNITY): Payer: BC Managed Care – PPO | Admitting: Anesthesiology

## 2022-02-06 LAB — RPR: RPR Ser Ql: NONREACTIVE

## 2022-02-06 MED ORDER — EPHEDRINE 5 MG/ML INJ: 10.0000 mg | INTRAVENOUS | Status: DC | PRN

## 2022-02-06 MED ORDER — PANTOPRAZOLE SODIUM 40 MG PO TBEC: 40.0000 mg | DELAYED_RELEASE_TABLET | Freq: Once | ORAL | Status: DC

## 2022-02-06 MED ORDER — DIPHENHYDRAMINE HCL 50 MG/ML IJ SOLN: 12.5000 mg | INTRAMUSCULAR | Status: DC | PRN

## 2022-02-06 MED ORDER — LIDOCAINE HCL (PF) 1 % IJ SOLN
INTRAMUSCULAR | Status: DC | PRN
  Administered 2022-02-06: 11 mL via EPIDURAL

## 2022-02-06 MED ORDER — LACTATED RINGERS IV SOLN
500.0000 mL | Freq: Once | INTRAVENOUS | Status: AC
  Administered 2022-02-06: 500 mL via INTRAVENOUS

## 2022-02-06 MED ORDER — MISOPROSTOL 50MCG HALF TABLET
50.0000 ug | ORAL_TABLET | ORAL | Status: DC | PRN
  Administered 2022-02-06: 50 ug via BUCCAL
  Filled 2022-02-06: qty 1

## 2022-02-06 MED ORDER — FENTANYL-BUPIVACAINE-NACL 0.5-0.125-0.9 MG/250ML-% EP SOLN
12.0000 mL/h | EPIDURAL | Status: DC | PRN
  Administered 2022-02-06: 12 mL/h via EPIDURAL
  Filled 2022-02-06: qty 250

## 2022-02-06 MED ORDER — PHENYLEPHRINE 40 MCG/ML (10ML) SYRINGE FOR IV PUSH (FOR BLOOD PRESSURE SUPPORT): 80.0000 ug | PREFILLED_SYRINGE | INTRAVENOUS | Status: DC | PRN

## 2022-02-06 NOTE — Anesthesia Preprocedure Evaluation (Signed)
Anesthesia Evaluation  ?Patient identified by MRN, date of birth, ID band ?Patient awake ? ? ? ?Reviewed: ?Allergy & Precautions, NPO status , Patient's Chart, lab work & pertinent test results ? ?Airway ?Mallampati: II ? ?TM Distance: >3 FB ?Neck ROM: Full ? ? ? Dental ?no notable dental hx. ? ?  ?Pulmonary ?neg pulmonary ROS,  ?  ?Pulmonary exam normal ?breath sounds clear to auscultation ? ? ? ? ? ? Cardiovascular ?negative cardio ROS ?Normal cardiovascular exam ?Rhythm:Regular Rate:Normal ? ? ?  ?Neuro/Psych ? Headaches, negative psych ROS  ? GI/Hepatic ?Neg liver ROS, GERD  ,  ?Endo/Other  ?negative endocrine ROS ? Renal/GU ?negative Renal ROS  ?negative genitourinary ?  ?Musculoskeletal ? ?(+) Arthritis , Osteoarthritis,   ? Abdominal ?  ?Peds ?negative pediatric ROS ?(+)  Hematology ?negative hematology ROS ?(+)   ?Anesthesia Other Findings ? ? Reproductive/Obstetrics ?(+) Pregnancy ? ?  ? ? ? ? ? ? ? ? ? ? ? ? ? ?  ?  ? ? ? ? ? ? ? ? ?Anesthesia Physical ?Anesthesia Plan ? ?ASA: 2 ? ?Anesthesia Plan: Epidural  ? ?Post-op Pain Management:   ? ?Induction:  ? ?PONV Risk Score and Plan:  ? ?Airway Management Planned:  ? ?Additional Equipment:  ? ?Intra-op Plan:  ? ?Post-operative Plan:  ? ?Informed Consent:  ? ?Plan Discussed with:  ? ?Anesthesia Plan Comments:   ? ? ? ? ? ? ?Anesthesia Quick Evaluation ? ?

## 2022-02-06 NOTE — Progress Notes (Signed)
Labor Progress Note ?Samantha Knapp is a 31 y.o. G1P0 at [redacted]w[redacted]d presented for IOL for elevated BP at term ? ?S:  ?Patient sleeping comfortably ? ?O:  ?BP 130/70   Pulse 74   Temp 98.1 ?F (36.7 ?C) (Oral)   Resp 18   Ht 5\' 5"  (1.651 m)   Wt 80.4 kg   LMP 04/30/2021   SpO2 99%   BMI 29.50 kg/m?  ? ?Fetal Tracing: ? ?Baseline: 135 ?Variability: moderate ?Accels: 15x15 ?Decels: none ? ?Toco: 2-7 ? ? ?CVE: Dilation: 1.5 ?Effacement (%): 80 ?Cervical Position: Posterior ?Station: -3 ?Presentation: Vertex ?Exam by:: Haynes Bast, CNM ? ? ?A&P: 31 y.o. G1P0 [redacted]w[redacted]d IOL for elevated BP at term ?#Labor: Progressing well. Attempted FB but cervix very posterior and unable to place due to patient discomfort. Will continue cytotec ?#Pain: per patient request ?#FWB: Cat 1 ?#GBS negative ? ?Wende Mott, CNM ?8:41 AM ? ?

## 2022-02-06 NOTE — Progress Notes (Signed)
Labor Progress Note ?[name] is a 31 y.o. G1P0 at [redacted]w[redacted]d presented for IOL for elevated BPs at term ? ?S:  ?More comfortable with epidural. Doesn't feel like right side is very numb yet ? ?O:  ?BP 139/82   Pulse 66   Temp (!) 97.5 ?F (36.4 ?C) (Axillary)   Resp 16   Ht 5\' 5"  (1.651 m)   Wt 80.4 kg   LMP 04/30/2021   SpO2 99%   BMI 29.50 kg/m?  ? ?Fetal Tracing: ? ?Baseline: 120 ?Variability: moderate  ?Accels: 15x15 ?Decels: none ? ?Toco: 1-2 ? ? ?CVE: 3/80/-1 ? ? ?A&P: 31 y.o. G1P0 [redacted]w[redacted]d IOL elevated BPs at term ?#Labor: Progressing well. Contracting every 1-2 minutes and changing cervix so will expectantly manage for now.  ? ?Patient reports SROM with clear fluid at 1245 ? ?#Pain: epidural ?#FWB: Cat 1 ?#GBS negative ? ? ?[redacted]w[redacted]d, CNM ?2:03 PM ? ?

## 2022-02-06 NOTE — Progress Notes (Addendum)
Labor Progress Note ?Samantha Knapp is a 31 y.o. G1P0 at [redacted]w[redacted]d presented for IOL d/t elevated BP at term  ?S: Patient is resting comfortably. ? ?O:  ?BP 134/80   Pulse 72   Temp 97.9 ?F (36.6 ?C) (Oral)   Resp 16   Ht 5\' 5"  (1.651 m)   Wt 80.4 kg   LMP 04/30/2021   SpO2 99%   BMI 29.50 kg/m?  ?EFM: baseline FHR 130/ moderate variability/+accels, no decels ? ?BSUS confirmed vertex position ?CVE: Dilation: 1.5 ?Effacement (%): Thick ?Cervical Position: Posterior ?Station: -3 ?Presentation: Vertex ?Exam by:: Dr. Jinny Sanders ? ? ?A&P: 31 y.o. G1P0 [redacted]w[redacted]d  ?#Labor: S/p 1x cytotec, will dose 2nd cytotec. Hopeful for FB placement on next check.  ?#Pain: controlled, PRN ?#FWB: Cat I ?#GBS negative ? ?#Elevated blood pressures without hypertensive diagnosis ?BP has been 120s/80s most recently, currently asymptomatic ?-Monitor ? ?#Previous breech s/p successful ECV ?-Cephalic position confirmed via BSUS ? ?Gerrit Heck, [place] for [employer], [place] ?4:16 AM  ?

## 2022-02-06 NOTE — Progress Notes (Signed)
Daylight Saving Time change. ?

## 2022-02-06 NOTE — Progress Notes (Signed)
Labor Progress Note ?Samantha Knapp is a 31 y.o. G1P0 at [redacted]w[redacted]d presented for IOL due to gHTN.  ? ?S: Feeling okay with epidural. Tired.  ? ?O:  ?BP 131/83   Pulse 83   Temp 99 ?F (37.2 ?C) (Oral)   Resp 16   Ht 5\' 5"  (1.651 m)   Wt 80.4 kg   LMP 04/30/2021   SpO2 99%   BMI 29.50 kg/m?  ?EFM: 130/mod/15x15/none ? ?CVE: Dilation: 6.5 ?Effacement (%): 90 ?Cervical Position: Posterior ?Station: -2 ?Presentation: Vertex ?Exam by:: [email or web address], DO ? ? ?A&P: 31 y.o. G1P0 [redacted]w[redacted]d  ?#Labor: Progressing well since last check on expectant management. Contractions about every 2-5 minutes, if spacing plan to check in 2 hours, otherwise will check in about 4. Continue expectant for now, add pit if unchanged next time.  ?#Pain: Epidural  ?#FWB: Cat I  ?#GBS negative ? ?#gHTN: Recent BP has been WNL. No symptoms. Will cont to monitor.  ? ?[redacted]w[redacted]d, DO ?9:39 PM  ?

## 2022-02-06 NOTE — Anesthesia Procedure Notes (Signed)
Epidural ?Patient location during procedure: OB ?Start time: 02/06/2022 1:27 PM ?End time: 02/06/2022 1:37 PM ? ?Staffing ?Anesthesiologist: Lowella Curb, MD ?Performed: anesthesiologist  ? ?Preanesthetic Checklist ?Completed: patient identified, IV checked, site marked, risks and benefits discussed, surgical consent, monitors and equipment checked, pre-op evaluation and timeout performed ? ?Epidural ?Patient position: sitting ?Prep: ChloraPrep ?Patient monitoring: heart rate, cardiac monitor, continuous pulse ox and blood pressure ?Approach: midline ?Location: L2-L3 ?Injection technique: LOR saline ? ?Needle:  ?Needle type: Tuohy  ?Needle gauge: 17 G ?Needle length: 9 cm ?Needle insertion depth: 5 cm ?Catheter type: closed end flexible ?Catheter size: 20 Guage ?Catheter at skin depth: 9 cm ?Test dose: negative ? ?Assessment ?Events: blood not aspirated, injection not painful, no injection resistance, no paresthesia and negative IV test ? ?Additional Notes ?Epidural placed by SRNA under direct supervisionReason for block:procedure for pain ? ? ? ?

## 2022-02-06 NOTE — Progress Notes (Signed)
Labor Progress Note ?Samantha Knapp is a 31 y.o. G1P0 at [redacted]w[redacted]d presented for IOL for gHTN ? ?S:  ?Patient sleeping ? ?O:  ?BP 112/63   Pulse 69   Temp 98.8 ?F (37.1 ?C) (Oral)   Resp 18   Ht 5\' 5"  (1.651 m)   Wt 80.4 kg   LMP 04/30/2021   SpO2 99%   BMI 29.50 kg/m?  ? ?Fetal Tracing: ? ?Baseline: 120 ?Variability: moderate ?Accels: 15x15 ?Decels: none ? ?Toco: 1-3 ? ? ?CVE: Dilation: 5 ?Effacement (%): 90 ?Cervical Position: Posterior ?Station: 0 ?Presentation: Vertex ?Exam by:: [email or web address], CNM ? ? ?A&P: 31 y.o. G1P0 [redacted]w[redacted]d IOL gHTN ?#Labor: Progressing well. Continue expectant management for now. Discussed augmentation if contractions space ?#Pain: epidural ?#FWB: Cat 1 ?#GBS negative ? ? ?[redacted]w[redacted]d, CNM ?5:20 PM ? ?

## 2022-02-07 ENCOUNTER — Other Ambulatory Visit: Payer: BC Managed Care – PPO | Admitting: Women's Health

## 2022-02-07 ENCOUNTER — Encounter (HOSPITAL_COMMUNITY): Payer: Self-pay | Admitting: Family Medicine

## 2022-02-07 DIAGNOSIS — O134 Gestational [pregnancy-induced] hypertension without significant proteinuria, complicating childbirth: Secondary | ICD-10-CM

## 2022-02-07 DIAGNOSIS — Z3A40 40 weeks gestation of pregnancy: Secondary | ICD-10-CM

## 2022-02-07 DIAGNOSIS — O139 Gestational [pregnancy-induced] hypertension without significant proteinuria, unspecified trimester: Secondary | ICD-10-CM

## 2022-02-07 DIAGNOSIS — O480 Post-term pregnancy: Secondary | ICD-10-CM

## 2022-02-07 DIAGNOSIS — Z30430 Encounter for insertion of intrauterine contraceptive device: Secondary | ICD-10-CM

## 2022-02-07 LAB — COMPREHENSIVE METABOLIC PANEL
ALT: 17 U/L (ref 0–44)
AST: 33 U/L (ref 15–41)
Albumin: 2.2 g/dL — ABNORMAL LOW (ref 3.5–5.0)
Alkaline Phosphatase: 159 U/L — ABNORMAL HIGH (ref 38–126)
Anion gap: 10 (ref 5–15)
BUN: 6 mg/dL (ref 6–20)
CO2: 21 mmol/L — ABNORMAL LOW (ref 22–32)
Calcium: 8 mg/dL — ABNORMAL LOW (ref 8.9–10.3)
Chloride: 104 mmol/L (ref 98–111)
Creatinine, Ser: 1.08 mg/dL — ABNORMAL HIGH (ref 0.44–1.00)
GFR, Estimated: >60 mL/min (ref >60)
Glucose, Bld: 107 mg/dL — ABNORMAL HIGH (ref 70–99)
Potassium: 3.4 mmol/L — ABNORMAL LOW (ref 3.5–5.1)
Sodium: 135 mmol/L (ref 135–145)
Total Bilirubin: 1.1 mg/dL (ref 0.3–1.2)
Total Protein: 5.3 g/dL — ABNORMAL LOW (ref 6.5–8.1)

## 2022-02-07 LAB — CBC
HCT: 31.2 % — ABNORMAL LOW (ref 36.0–46.0)
Hemoglobin: 10.5 g/dL — ABNORMAL LOW (ref 12.0–15.0)
MCH: 27.8 pg (ref 26.0–34.0)
MCHC: 33.7 g/dL (ref 30.0–36.0)
MCV: 82.5 fL (ref 80.0–100.0)
Platelets: 210 10*3/uL (ref 150–400)
RBC: 3.78 MIL/uL — ABNORMAL LOW (ref 3.87–5.11)
RDW: 13.9 % (ref 11.5–15.5)
WBC: 20.9 10*3/uL — ABNORMAL HIGH (ref 4.0–10.5)
nRBC: 0 % (ref 0.0–0.2)

## 2022-02-07 MED ORDER — TETANUS-DIPHTH-ACELL PERTUSSIS 5-2.5-18.5 LF-MCG/0.5 IM SUSY: 0.5000 mL | PREFILLED_SYRINGE | Freq: Once | INTRAMUSCULAR | Status: DC

## 2022-02-07 MED ORDER — COCONUT OIL OIL: 1.0000 "application " | TOPICAL_OIL | Status: DC | PRN

## 2022-02-07 MED ORDER — ONDANSETRON HCL 4 MG/2ML IJ SOLN: 4.0000 mg | INTRAMUSCULAR | Status: DC | PRN

## 2022-02-07 MED ORDER — ACETAMINOPHEN 325 MG PO TABS: 650.0000 mg | ORAL_TABLET | ORAL | Status: DC | PRN

## 2022-02-07 MED ORDER — OXYTOCIN-SODIUM CHLORIDE 30-0.9 UT/500ML-% IV SOLN
1.0000 m[IU]/min | INTRAVENOUS | Status: DC
  Administered 2022-02-07: 2 m[IU]/min via INTRAVENOUS

## 2022-02-07 MED ORDER — ONDANSETRON HCL 4 MG PO TABS: 4.0000 mg | ORAL_TABLET | ORAL | Status: DC | PRN

## 2022-02-07 MED ORDER — IBUPROFEN 600 MG PO TABS: 600.0000 mg | ORAL_TABLET | Freq: Four times a day (QID) | ORAL | Status: DC

## 2022-02-07 MED ORDER — SODIUM CHLORIDE 0.9% FLUSH: 3.0000 mL | INTRAVENOUS | Status: DC | PRN

## 2022-02-07 MED ORDER — BENZOCAINE-MENTHOL 20-0.5 % EX AERO
1.0000 | INHALATION_SPRAY | CUTANEOUS | Status: DC | PRN
Start: 2022-02-07 — End: 2022-02-08
  Administered 2022-02-07: 1 via TOPICAL
  Filled 2022-02-07: qty 56

## 2022-02-07 MED ORDER — LEVONORGESTREL 20.1 MCG/DAY IU IUD
1.0000 | INTRAUTERINE_SYSTEM | Freq: Once | INTRAUTERINE | Status: AC
  Administered 2022-02-07: 1 via INTRAUTERINE
  Filled 2022-02-07: qty 1

## 2022-02-07 MED ORDER — TERBUTALINE SULFATE 1 MG/ML IJ SOLN: 0.2500 mg | Freq: Once | INTRAMUSCULAR | Status: DC | PRN

## 2022-02-07 MED ORDER — FUROSEMIDE 20 MG PO TABS
20.0000 mg | ORAL_TABLET | Freq: Every day | ORAL | Status: DC
Start: 2022-02-08 — End: 2022-02-08
  Administered 2022-02-08: 20 mg via ORAL
  Filled 2022-02-07: qty 1

## 2022-02-07 MED ORDER — MEASLES, MUMPS & RUBELLA VAC IJ SOLR: 0.5000 mL | Freq: Once | INTRAMUSCULAR | Status: DC

## 2022-02-07 MED ORDER — SENNOSIDES-DOCUSATE SODIUM 8.6-50 MG PO TABS
2.0000 | ORAL_TABLET | ORAL | Status: DC
  Administered 2022-02-07 – 2022-02-08 (×2): 2 via ORAL
  Filled 2022-02-07 (×2): qty 2

## 2022-02-07 MED ORDER — SODIUM CHLORIDE 0.9% FLUSH: 3.0000 mL | Freq: Two times a day (BID) | INTRAVENOUS | Status: DC

## 2022-02-07 MED ORDER — ACETAMINOPHEN 325 MG PO TABS
650.0000 mg | ORAL_TABLET | ORAL | Status: DC
  Administered 2022-02-07 – 2022-02-08 (×7): 650 mg via ORAL
  Filled 2022-02-07 (×7): qty 2

## 2022-02-07 MED ORDER — ZOLPIDEM TARTRATE 5 MG PO TABS: 5.0000 mg | ORAL_TABLET | Freq: Every evening | ORAL | Status: DC | PRN

## 2022-02-07 MED ORDER — SODIUM CHLORIDE 0.9 % IV SOLN: 250.0000 mL | INTRAVENOUS | Status: DC | PRN

## 2022-02-07 MED ORDER — WITCH HAZEL-GLYCERIN EX PADS
1.0000 "application " | MEDICATED_PAD | CUTANEOUS | Status: DC | PRN
  Administered 2022-02-07: 1 via TOPICAL

## 2022-02-07 MED ORDER — SIMETHICONE 80 MG PO CHEW: 80.0000 mg | CHEWABLE_TABLET | ORAL | Status: DC | PRN

## 2022-02-07 MED ORDER — IBUPROFEN 200 MG PO TABS
400.0000 mg | ORAL_TABLET | Freq: Four times a day (QID) | ORAL | Status: DC | PRN
  Administered 2022-02-08: 400 mg via ORAL
  Filled 2022-02-07: qty 2

## 2022-02-07 MED ORDER — DIBUCAINE (PERIANAL) 1 % EX OINT
1.0000 "application " | TOPICAL_OINTMENT | CUTANEOUS | Status: DC | PRN
  Administered 2022-02-07: 1 via RECTAL
  Filled 2022-02-07: qty 28

## 2022-02-07 MED ORDER — PRENATAL MULTIVITAMIN CH
1.0000 | ORAL_TABLET | Freq: Every day | ORAL | Status: DC
  Administered 2022-02-07 – 2022-02-08 (×2): 1 via ORAL
  Filled 2022-02-07 (×2): qty 1

## 2022-02-07 MED ORDER — DIPHENHYDRAMINE HCL 25 MG PO CAPS: 25.0000 mg | ORAL_CAPSULE | Freq: Four times a day (QID) | ORAL | Status: DC | PRN

## 2022-02-07 NOTE — Anesthesia Postprocedure Evaluation (Signed)
Anesthesia Post Note ? ?Patient: Samantha Knapp ? ?Procedure(s) Performed: AN AD HOC LABOR EPIDURAL ? ?  ? ?Patient location during evaluation: Mother Baby ?Anesthesia Type: Epidural ?Level of consciousness: awake and alert ?Pain management: pain level controlled ?Vital Signs Assessment: post-procedure vital signs reviewed and stable ?Respiratory status: spontaneous breathing, nonlabored ventilation and respiratory function stable ?Cardiovascular status: stable ?Postop Assessment: no headache, no backache and epidural receding ?Anesthetic complications: no ? ? ?No notable events documented. ? ?Last Vitals:  ?Vitals:  ? 02/07/22 0909 02/07/22 1330  ?BP: 111/65 135/75  ?Pulse:    ?Resp: 16 16  ?Temp: 36.7 ?C 36.8 ?C  ?SpO2: 97% 98%  ?  ?Last Pain:  ?Vitals:  ? 02/07/22 1330  ?TempSrc: Oral  ?PainSc: 3   ? ?Pain Goal:   ? ?  ?  ?  ?  ?  ?  ?  ? ?[name] ? ? ? ? ?

## 2022-02-07 NOTE — Procedures (Signed)
Post-Placental IUD Insertion Procedure Note Penni Bombard)  ? ?Patient identified, informed consent signed prior to delivery, signed copy in chart, time out was performed.   ? ?Vaginal, labial and perineal areas thoroughly inspected for lacerations. 2nd degree laceration identified, minimally bleeding and not repaired prior to insertion of IUD.   ? ?IUD grasped between sterile gloved fingers. Sterile lubrication applied to sterile gloved hand for ease of insertion. Fundus identified through abdominal wall using non-insertion hand. IUD inserted to fundus with bimanual technique. IUD carefully released at the fundus and insertion hand gently removed from vagina.  ? ?  ?Strings trimmed to the level of the introitus. Patient tolerated procedure well. ? ?Patient given post procedure instructions.  Patient is asked to keep IUD strings tucked in her vagina until her postpartum follow up visit in 4-6 weeks. Patient advised to abstain from sexual intercourse and pulling on strings before her follow-up visit. Patient verbalized an understanding of the plan of care and agrees.   ? ?Allayne Stack, DO  ? ?

## 2022-02-07 NOTE — Discharge Summary (Shared)
Postpartum Discharge Summary  Date of Service updated***     Patient Name: Samantha Knapp DOB: 1990/12/05 MRN: [id number]  Date of admission: 02/05/2022 Delivery date:02/07/2022  Delivering provider: Patriciaann Clan  Date of discharge: 02/07/2022  Admitting diagnosis: Post term pregnancy over 40 weeks [O48.0] Intrauterine pregnancy: [redacted]w[redacted]d    Secondary diagnosis:  Principal Problem:   Post term pregnancy over 40 weeks Active Problems:   Cervical high risk HPV (human papillomavirus) test positive   Shoulder dystocia during labor and delivery   Gestational hypertension  Additional problems: ***    Discharge diagnosis: Term Pregnancy Delivered and Gestational Hypertension                                              Post partum procedures:{Postpartum procedures:23558} Augmentation: Pitocin and Cytotec Complications: Shoulder dystocia   Hospital course: Induction of Labor With Vaginal Delivery   31y.o. yo G1P1001 at 473w3das admitted to the hospital 02/05/2022 for induction of labor.  Indication for induction: Gestational hypertension.  Patient had an uncomplicated labor course as follows: Membrane Rupture Time/Date: 12:47 PM ,02/06/2022   Delivery Method:Vaginal, Spontaneous  Episiotomy: None  Lacerations:  2nd degree;Perineal  Details of delivery can be found in separate delivery note.  Patient had a routine postpartum course. Patient is discharged home 02/07/22.  Newborn Data: Birth date:02/07/2022  Birth time:4:57 AM  Gender:Female  Living status:Living  Apgars:6 ,9  Weight:3650 g   Magnesium Sulfate received: No BMZ received: No Rhophylac:No MMR:No T-DaP:Given prenatally Flu: No Transfusion:No  Physical exam  Vitals:   02/07/22 0909 02/07/22 1330 02/07/22 1744 02/07/22 2039  BP: 111/65 135/75 129/85 119/81  Pulse:   80 66  Resp: 16 16 18 18   Temp: 98 F (36.7 C) 98.2 F (36.8 C) 98.5 F (36.9 C) 98 F (36.7 C)  TempSrc: Oral Oral Oral Oral   SpO2: 97% 98% 99%   Weight:      Height:       General: {Exam; general:21111117} Lochia: {Desc; appropriate/inappropriate:30686::"appropriate"} Uterine Fundus: {Desc; firm/soft:30687} Incision: {Exam; incision:21111123} DVT Evaluation: {Exam; dvt:2111122} Labs: Lab Results  Component Value Date   WBC 20.9 (H) 02/07/2022   HGB 10.5 (L) 02/07/2022   HCT 31.2 (L) 02/07/2022   MCV 82.5 02/07/2022   PLT 210 02/07/2022   CMP Latest Ref Rng & Units 02/07/2022  Glucose 70 - 99 mg/dL 107(H)  BUN 6 - 20 mg/dL 6  Creatinine 0.44 - 1.00 mg/dL 1.08(H)  Sodium 135 - 145 mmol/L 135  Potassium 3.5 - 5.1 mmol/L 3.4(L)  Chloride 98 - 111 mmol/L 104  CO2 22 - 32 mmol/L 21(L)  Calcium 8.9 - 10.3 mg/dL 8.0(L)  Total Protein 6.5 - 8.1 g/dL 5.3(L)  Total Bilirubin 0.3 - 1.2 mg/dL 1.1  Alkaline Phos 38 - 126 U/L 159(H)  AST 15 - 41 U/L 33  ALT 0 - 44 U/L 17   Edinburgh Score: Edinburgh Postnatal Depression Scale Screening Tool 02/07/2022  I have been able to laugh and see the funny side of things. 0  I have looked forward with enjoyment to things. 0  I have blamed myself unnecessarily when things went wrong. 1  I have been anxious or worried for no good reason. 1  I have felt scared or panicky for no good reason. 2  Things have been getting on top of  me. 1  I have been so unhappy that I have had difficulty sleeping. 0  I have felt sad or miserable. 1  I have been so unhappy that I have been crying. 1  The thought of harming myself has occurred to me. 0  Edinburgh Postnatal Depression Scale Total 7     After visit meds:  Allergies as of 02/07/2022   No Known Allergies   Med Rec must be completed prior to using this [facility]***        Discharge home in stable condition Infant Feeding: {Baby feeding:23562} Infant Disposition:{CHL IP OB HOME WITH EOFHQR:97588} Discharge instruction: per After Visit Summary and Postpartum booklet. Activity: Advance as tolerated. Pelvic rest for 6  weeks.  Diet: routine diet Future Appointments: No future appointments.  Follow up Visit:  Message sent to FT by Dr Higinio Plan:  Please schedule this patient for a In person postpartum visit in 6 weeks with the following provider: Any provider. Additional Postpartum F/U:BP check 1 week  High risk pregnancy complicated by: HTN Delivery mode:  Vaginal, Spontaneous  Anticipated Birth Control:  PP IUD placed (needs strings checked at pp visit)    02/07/2022 Patriciaann Clan, DO

## 2022-02-08 ENCOUNTER — Other Ambulatory Visit (HOSPITAL_COMMUNITY): Payer: Self-pay

## 2022-02-08 LAB — CREATININE, SERUM
Creatinine, Ser: 0.79 mg/dL (ref 0.44–1.00)
GFR, Estimated: >60 mL/min (ref >60)

## 2022-02-08 MED ORDER — ACETAMINOPHEN 325 MG PO TABS: 650.0000 mg | ORAL_TABLET | ORAL | Status: AC

## 2022-02-08 MED ORDER — IBUPROFEN 400 MG PO TABS
400.0000 mg | ORAL_TABLET | Freq: Four times a day (QID) | ORAL | 0 refills | Status: AC | PRN
  Filled 2022-02-08: qty 60, 15d supply, fill #0

## 2022-02-08 MED ORDER — FUROSEMIDE 20 MG PO TABS
20.0000 mg | ORAL_TABLET | Freq: Every day | ORAL | 0 refills | Status: AC
  Filled 2022-02-08: qty 4, 4d supply, fill #0

## 2022-02-10 ENCOUNTER — Inpatient Hospital Stay (HOSPITAL_COMMUNITY): Admission: AD | Admit: 2022-02-10 | Payer: BC Managed Care – PPO | Source: Home / Self Care | Admitting: Family Medicine

## 2022-02-10 ENCOUNTER — Inpatient Hospital Stay (HOSPITAL_COMMUNITY): Payer: BC Managed Care – PPO

## 2022-02-15 ENCOUNTER — Other Ambulatory Visit: Payer: Self-pay

## 2022-02-15 ENCOUNTER — Ambulatory Visit (INDEPENDENT_AMBULATORY_CARE_PROVIDER_SITE_OTHER): Payer: BC Managed Care – PPO | Admitting: *Deleted

## 2022-02-15 VITALS — BP 153/104 | HR 87

## 2022-02-15 DIAGNOSIS — Z0130 Encounter for examination of blood pressure without abnormal findings: Secondary | ICD-10-CM

## 2022-02-15 NOTE — Progress Notes (Signed)
? ?  NURSE VISIT- BLOOD PRESSURE CHECK ? ?SUBJECTIVE:  ?Samantha Knapp is a 31 y.o. G67P1001 female here for BP check. She is postpartum, delivery date 02/07/22    ? ?HYPERTENSION ROS: ? ?Postpartum:  ?Severe headaches that don't go away with tylenol/other medicines: No  ?Visual changes (seeing spots/double/blurred vision) No  ?Severe pain under right breast breast or in center of upper chest No  ?Severe nausea/vomiting No  ?Taking medicines as instructed not applicable ? ? ?OBJECTIVE:  ?LMP 04/30/2021   ?Appearance alert, well appearing, and in no distress and oriented to person, place, and time. ? ?ASSESSMENT: ?Postpartum  blood pressure check ? ?PLAN: ?Discussed with Dr. Charlotta Newton   ?Recommendations: no changes needed, let us know if blood pressures higher at home or develop any symptoms ?Follow-up: as scheduled  ? ?Debbe Odea Rocket Gunderson  ?02/15/2022 ?9:37 AM ? ?

## 2022-03-22 ENCOUNTER — Ambulatory Visit: Payer: BC Managed Care – PPO | Admitting: Women's Health

## 2022-03-24 ENCOUNTER — Ambulatory Visit (INDEPENDENT_AMBULATORY_CARE_PROVIDER_SITE_OTHER): Payer: BC Managed Care – PPO | Admitting: Obstetrics & Gynecology

## 2022-03-24 ENCOUNTER — Encounter: Payer: Self-pay | Admitting: Obstetrics & Gynecology

## 2022-03-24 NOTE — Progress Notes (Signed)
Subjective:  ?  ? Samantha Knapp is a 31 y.o. female who presents for a postpartum visit. She is 5 weeks postpartum following a spontaneous vaginal delivery. I have fully reviewed the prenatal and intrapartum course. The delivery was at 40 gestational weeks. Outcome: spontaneous vaginal delivery. Anesthesia: epidural. Postpartum course has been unremarkable. Baby's course has been normal. Baby is feeding by  bottle . Bleeding no bleeding. Bowel function is normal. Bladder function is normal. Patient is not sexually active. Contraception method is IUD. Postpartum depression screening: negative. ? ?The following portions of the patient's history were reviewed and updated as appropriate: allergies, current medications, past family history, past medical history, past social history, past surgical history, and problem list. ? ?Review of Systems ?Pertinent items are noted in HPI.  ? ?Objective:  ? ? BP 126/72 (BP Location: Left Arm, Patient Position: Sitting, Cuff Size: Normal)   Pulse 77   Wt 156 lb (70.8 kg)   Breastfeeding No   BMI 25.96 kg/m?   ?General:  alert, cooperative, and no distress  ? Breasts:  inspection negative, no nipple discharge or bleeding, no masses or nodularity palpable  ?Lungs:   ?Heart:    ?Abdomen:   ? Vulva:  normal  ?Vagina: not evaluated  ?Cervix:  IUD strings arethru cervix, not too long at this point  ?Corpus: Normal involution  ?Adnexa:  normal adnexa  ?Rectal Exam: Normal rectovaginal exam  ?      ?Assessment:  ? ? Normal  postpartum exam. Pap smear not done at today's visit.  ? ?Plan:  ? ? 1. Contraception: IUD ?2.  ?3. Follow up in: 3  years  or as needed.  ?

## 2022-03-28 DIAGNOSIS — J029 Acute pharyngitis, unspecified: Secondary | ICD-10-CM | POA: Diagnosis not present

## 2022-03-28 DIAGNOSIS — J020 Streptococcal pharyngitis: Secondary | ICD-10-CM | POA: Diagnosis not present

## 2022-07-18 ENCOUNTER — Emergency Department (HOSPITAL_COMMUNITY)
Admission: EM | Admit: 2022-07-18 | Discharge: 2022-07-18 | Disposition: A | Discharge status: Home / Self Care | Payer: BC Managed Care – PPO | Attending: Emergency Medicine | Admitting: Emergency Medicine

## 2022-07-18 ENCOUNTER — Emergency Department (HOSPITAL_COMMUNITY): Payer: BC Managed Care – PPO

## 2022-07-18 ENCOUNTER — Encounter (HOSPITAL_COMMUNITY): Payer: Self-pay

## 2022-07-18 ENCOUNTER — Other Ambulatory Visit: Payer: Self-pay

## 2022-07-18 DIAGNOSIS — Z5321 Procedure and treatment not carried out due to patient leaving prior to being seen by health care provider: Secondary | ICD-10-CM | POA: Insufficient documentation

## 2022-07-18 DIAGNOSIS — R110 Nausea: Secondary | ICD-10-CM | POA: Diagnosis not present

## 2022-07-18 DIAGNOSIS — R1030 Lower abdominal pain, unspecified: Secondary | ICD-10-CM | POA: Diagnosis not present

## 2022-07-18 DIAGNOSIS — R102 Pelvic and perineal pain: Secondary | ICD-10-CM | POA: Diagnosis not present

## 2022-07-18 LAB — COMPREHENSIVE METABOLIC PANEL
ALT: 13 U/L (ref 0–44)
AST: 17 U/L (ref 15–41)
Albumin: 3.8 g/dL (ref 3.5–5.0)
Alkaline Phosphatase: 57 U/L (ref 38–126)
Anion gap: 5 (ref 5–15)
BUN: 8 mg/dL (ref 6–20)
CO2: 25 mmol/L (ref 22–32)
Calcium: 8.8 mg/dL — ABNORMAL LOW (ref 8.9–10.3)
Chloride: 107 mmol/L (ref 98–111)
Creatinine, Ser: 0.86 mg/dL (ref 0.44–1.00)
GFR, Estimated: >60 mL/min (ref >60)
Glucose, Bld: 103 mg/dL — ABNORMAL HIGH (ref 70–99)
Potassium: 3.7 mmol/L (ref 3.5–5.1)
Sodium: 137 mmol/L (ref 135–145)
Total Bilirubin: 0.9 mg/dL (ref 0.3–1.2)
Total Protein: 6.7 g/dL (ref 6.5–8.1)

## 2022-07-18 LAB — CBC
HCT: 37.7 % (ref 36.0–46.0)
Hemoglobin: 12.5 g/dL (ref 12.0–15.0)
MCH: 28 pg (ref 26.0–34.0)
MCHC: 33.2 g/dL (ref 30.0–36.0)
MCV: 84.5 fL (ref 80.0–100.0)
Platelets: 298 10*3/uL (ref 150–400)
RBC: 4.46 MIL/uL (ref 3.87–5.11)
RDW: 13.1 % (ref 11.5–15.5)
WBC: 9.7 10*3/uL (ref 4.0–10.5)
nRBC: 0 % (ref 0.0–0.2)

## 2022-07-18 LAB — LIPASE, BLOOD: Lipase: 34 U/L (ref 11–51)

## 2022-07-18 LAB — I-STAT BETA HCG BLOOD, ED (MC, WL, AP ONLY): I-stat hCG, quantitative: <5 m[IU]/mL (ref <5)

## 2022-07-18 NOTE — ED Provider Triage Note (Signed)
Emergency Medicine Provider Triage Evaluation Note  Samantha Knapp , a 31 y.o. female  was evaluated in triage.  Pt complains of suprapubic pain, cramping, nausea for the last couple of days.  Patient concerned that IUD may be misplaced because this has happened to her mother, and reports it felt similar.  She additionally reports that husband has not been able to feel her IUD.  She reports that she cannot tell on a self check because she has never been able to feel it.  She is 5 months postpartum and is concerned about the pain because she wants to able to take care of her child.  She denies excessive vaginal discharge, vaginal bleeding.  She just got off her period.  Review of Systems  Positive: Right suprapubic pain, nausea Negative: Vomiting, diarrhea, fever, chills, vaginal discharge, dyspareunia, vaginal bleeding  Physical Exam  BP 127/62   Pulse 88   Temp 98.4 F (36.9 C) (Oral)   Resp 18   Ht 5\' 5"  (1.651 m)   Wt 70.8 kg   SpO2 100%   BMI 25.96 kg/m  Gen:   Awake, no distress   Resp:  Normal effort  MSK:   Moves extremities without difficulty  Other:  Minimal tenderness to palpation right suprapubic region, no rebound, rigidity, guarding.  Medical Decision Making  Medically screening exam initiated at 2:43 PM.  Appropriate orders placed.  Shawnita Krizek Rodgers was informed that the remainder of the evaluation will be completed by another provider, this initial triage assessment does not replace that evaluation, and the importance of remaining in the ED until their evaluation is complete.  Work-up initiated   Hennie Duos, Olene Floss 07/18/22 1448

## 2022-07-18 NOTE — ED Notes (Signed)
Pt did not want to wait. Pt went home

## 2022-07-18 NOTE — ED Triage Notes (Signed)
Reports has been having stomach cramps and reports usually when she has sex her husband can feel the IUD string and he has not the last couple times.  Wants to make sure it is in place.  Just got off period.

## 2022-07-20 ENCOUNTER — Ambulatory Visit: Payer: BC Managed Care – PPO | Admitting: Adult Health

## 2022-12-07 ENCOUNTER — Ambulatory Visit
Admission: EM | Admit: 2022-12-07 | Discharge: 2022-12-07 | Disposition: A | Discharge status: Home / Self Care | Payer: Medicaid Other | Attending: Nurse Practitioner | Admitting: Nurse Practitioner

## 2022-12-07 ENCOUNTER — Encounter: Payer: Self-pay | Admitting: Emergency Medicine

## 2022-12-07 ENCOUNTER — Other Ambulatory Visit: Payer: Self-pay

## 2022-12-07 DIAGNOSIS — R21 Rash and other nonspecific skin eruption: Secondary | ICD-10-CM

## 2022-12-07 DIAGNOSIS — M5441 Lumbago with sciatica, right side: Secondary | ICD-10-CM | POA: Diagnosis not present

## 2022-12-07 MED ORDER — KETOROLAC TROMETHAMINE 30 MG/ML IJ SOLN
30.0000 mg | Freq: Once | INTRAMUSCULAR | Status: AC
  Administered 2022-12-07: 30 mg via INTRAMUSCULAR

## 2022-12-07 MED ORDER — CETIRIZINE HCL 10 MG PO TABS: 10.0000 mg | ORAL_TABLET | Freq: Every day | ORAL | 0 refills | Status: AC

## 2022-12-07 MED ORDER — METHOCARBAMOL 500 MG PO TABS: 500.0000 mg | ORAL_TABLET | Freq: Two times a day (BID) | ORAL | 0 refills | Status: AC

## 2022-12-07 MED ORDER — DEXAMETHASONE SODIUM PHOSPHATE 10 MG/ML IJ SOLN
10.0000 mg | INTRAMUSCULAR | Status: AC
  Administered 2022-12-07: 10 mg via INTRAMUSCULAR

## 2022-12-07 MED ORDER — PREDNISONE 20 MG PO TABS: 40.0000 mg | ORAL_TABLET | Freq: Every day | ORAL | 0 refills | Status: AC

## 2022-12-07 MED ORDER — FAMOTIDINE 20 MG PO TABS: 20.0000 mg | ORAL_TABLET | Freq: Every day | ORAL | 0 refills | Status: AC

## 2022-12-07 NOTE — ED Provider Notes (Signed)
[facility]    CSN: [id number] Arrival date & time: 12/07/22  0806      History   Chief Complaint Chief Complaint  Patient presents with   Rash    HPI Samantha Knapp is a 32 y.o. female.   The history is provided by the patient.   The patient presents for complaints of rash and right-sided low back pain.  Patient states rash has been present over the last several days.  Patient states that rash is located only on the "upper part" of her body.  She states the rash is worse at night and is itchy in certain areas.  She denies any new exposures to soaps, foods, medications, detergents, or lotions.  She states she has been using Benadryl cream for the rash.  Patient presents for complaints of low back pain.  Pain is located on the right side of her lower back.  She states pain is now radiating to the right leg.  She states pain worsens with certain movements such as bending forward, rolling over in bed.  She states that she has not had any new injury or trauma, and denies urinary symptoms and loss of bowel or bladder function.  Patient reports that she does have a history of degenerative disease of her back.  She states that she has used roll on pain relievers with minimal relief.  Patient reports she is not breast-feeding.  Past Medical History:  Diagnosis Date   DDD (degenerative disc disease), lumbar    Frequent UTI    GERD (gastroesophageal reflux disease)    Migraines    Has improved.    Patient Active Problem List   Diagnosis Date Noted   Shoulder dystocia during labor and delivery 02/07/2022   Gestational hypertension 02/07/2022   Post term pregnancy over 40 weeks 02/05/2022   Cervical high risk HPV (human papillomavirus) test positive 08/12/2021   Chronic low back pain 08/14/2012    Past Surgical History:  Procedure Laterality Date   NO PAST SURGERIES     TYMPANOSTOMY TUBE PLACEMENT      OB History     Gravida  1   Para  1   Term  1    Preterm      AB      Living  1      SAB      IAB      Ectopic      Multiple  0   Live Births  1            Home Medications    Prior to Admission medications   Medication Sig Start Date End Date Taking? Authorizing Provider  cetirizine (ZYRTEC) 10 MG tablet Take 1 tablet (10 mg total) by mouth daily. 12/07/22  Yes Jamarrius Salay-Warren, Alda Lea, NP  famotidine (PEPCID) 20 MG tablet Take 1 tablet (20 mg total) by mouth daily. 12/07/22 01/06/23 Yes Syris Brookens-Warren, Alda Lea, NP  methocarbamol (ROBAXIN) 500 MG tablet Take 1 tablet (500 mg total) by mouth 2 (two) times daily. 12/07/22  Yes Lissette Schenk-Warren, Alda Lea, NP  predniSONE (DELTASONE) 20 MG tablet Take 2 tablets (40 mg total) by mouth daily with breakfast for 5 days. 12/07/22 12/12/22 Yes Ane Conerly-Warren, Alda Lea, NP  acetaminophen (TYLENOL) 325 MG tablet Take 2 tablets (650 mg total) by mouth every 4 (four) hours. Patient not taking: Reported on 02/15/2022 02/08/22   Patriciaann Clan, DO  Blood Pressure Monitor MISC For regular home bp monitoring during pregnancy Patient not  taking: Reported on 02/15/2022 07/30/21   Cheral Marker, CNM  fluticasone [facility]) 50 MCG/ACT nasal spray Place 2 sprays into both nostrils daily. Patient not taking: Reported on 02/15/2022 01/30/22   Wallis Bamberg, PA-C  furosemide (LASIX) 20 MG tablet Take 1 tablet (20 mg total) by mouth daily for 4 days. 02/08/22 02/12/22  Allayne Stack, DO  ibuprofen (ADVIL) 400 MG tablet Take 1 tablet (400 mg total) by mouth every 6 (six) hours as needed for cramping. Patient not taking: Reported on 03/24/2022 02/08/22   Allayne Stack, DO  omeprazole (PRILOSEC) 20 MG capsule Take 1 capsule (20 mg total) by mouth daily. 1 tablet a day Patient not taking: Reported on 02/15/2022 12/27/21   Lazaro Arms, MD  levonorgestrel (MIRENA, 52 MG,) 20 MCG/24HR IUD 1 Intra Uterine Device (1 each total) by Intrauterine route once. 08/31/16 06/01/21  Nelwyn Salisbury, MD    Family  History Family History  Problem Relation Age of Onset   Diabetes Mother    Hyperlipidemia Mother    Scoliosis Brother    Headache Brother    Diabetes Maternal Grandmother    Lung cancer Maternal Grandmother    Diabetes Maternal Grandfather    Esophageal cancer Maternal Grandfather    Cancer Paternal Grandmother    Breast cancer Paternal Grandmother    Lymphoma Paternal Grandfather     Social History Social History   Tobacco Use   Smoking status: Never   Smokeless tobacco: Never  Vaping Use   Vaping Use: Never used  Substance Use Topics   Alcohol use: Not Currently    Comment: occ   Drug use: Not Currently     Allergies   Patient has no known allergies.   Review of Systems Review of Systems Per HPI  Physical Exam Triage Vital Signs ED Triage Vitals  Enc Vitals Group     BP 12/07/22 0835 121/73     Pulse Rate 12/07/22 0835 88     Resp 12/07/22 0835 19     Temp 12/07/22 0835 98.1 F (36.7 C)     Temp Source 12/07/22 0835 Oral     SpO2 12/07/22 0835 98 %     Weight --      Height --      Head Circumference --      Peak Flow --      Pain Score 12/07/22 0842 0     Pain Loc --      Pain Edu? --      Excl. in GC? --    No data found.  Updated Vital Signs BP 121/73 (BP Location: Right Arm)   Pulse 88   Temp 98.1 F (36.7 C) (Oral)   Resp 19   SpO2 98%   Breastfeeding No   Visual Acuity Right Eye Distance:   Left Eye Distance:   Bilateral Distance:    Right Eye Near:   Left Eye Near:    Bilateral Near:     Physical Exam Vitals and nursing note reviewed.  Constitutional:      General: She is not in acute distress.    Appearance: Normal appearance.  HENT:     Head: Normocephalic.     Mouth/Throat:     Mouth: Mucous membranes are moist.     Pharynx: No posterior oropharyngeal erythema.  Eyes:     Extraocular Movements: Extraocular movements intact.     Conjunctiva/sclera: Conjunctivae normal.     Pupils: Pupils are equal, round, and  reactive to light.  Cardiovascular:     Rate and Rhythm: Normal rate and regular rhythm.     Pulses: Normal pulses.     Heart sounds: Normal heart sounds.  Pulmonary:     Effort: Pulmonary effort is normal. No respiratory distress.     Breath sounds: Normal breath sounds. No stridor. No wheezing, rhonchi or rales.  Abdominal:     General: Bowel sounds are normal.     Palpations: Abdomen is soft.     Tenderness: There is no abdominal tenderness.  Musculoskeletal:     Cervical back: Normal range of motion.     Lumbar back: Spasms (right paraspinal region) and tenderness (right paraspinal region) present. No swelling or deformity. Negative right straight leg raise test.     Comments: Tenderness noted to the right sciatic nerve  Lymphadenopathy:     Cervical: No cervical adenopathy.  Skin:    General: Skin is warm and dry.     Findings: Rash present. Rash is macular and papular.     Comments: Maculopapular rash located to the bilateral upper extremities, chest, abdomen, and trunk.  Rash is migrating to the neck and face.  No rash noted to the lower extremities.  There is no oozing, fluctuance, or drainage present.  Neurological:     General: No focal deficit present.     Mental Status: She is alert and oriented to person, place, and time.  Psychiatric:        Mood and Affect: Mood normal.        Behavior: Behavior normal.      UC Treatments / Results  Labs (all labs ordered are listed, but only abnormal results are displayed) Labs Reviewed - No data to display  EKG   Radiology No results found.  Procedures Procedures (including critical care time)  Medications Ordered in UC Medications  dexamethasone (DECADRON) injection 10 mg (10 mg Intramuscular Given 12/07/22 0945)  ketorolac (TORADOL) 30 MG/ML injection 30 mg (30 mg Intramuscular Given 12/07/22 0945)    Initial Impression / Assessment and Plan / UC Course  I have reviewed the triage vital signs and the nursing  notes.  Pertinent labs & imaging results that were available during my care of the patient were reviewed by me and considered in my medical decision making (see chart for details).  Patient is well-appearing, she is in no acute distress, vital signs are stable.  Differential diagnoses include contact dermatitis, eczema, and psoriasis.  Cannot determine trigger of patient's symptoms.  Decadron 10 mg IM administered due to distribution of the patient's rash and large body surface area that is covering.  With regard to her back pain, symptoms consistent with right-sided low back pain with sciatica.  Toradol 30 mg IM was administered while she was in the office today.  Will start patient on prednisone 40 mg to help with her sciatica and with the rash.  For the rash, patient was also prescribed Pepcid 20 mg daily, and cetirizine 10 mg daily to help with itching.  Shared decision making to defer imaging at this time.  For her back pain, patient was advised that the prednisone most likely will help with her pain, also recommended the use of over-the-counter analgesics such as Tylenol or ibuprofen.  Patient was given prescription for methocarbamol 500 mg as needed for spasm.  Supportive care recommendations were provided to the patient to include sciatica exercises.  Patient was given strict follow-up precautions along with ER precautions.  Patient verbalizes understanding.  All questions were answered.  Patient is stable for discharge.  Final Clinical Impressions(s) / UC Diagnoses   Final diagnoses:  Rash and nonspecific skin eruption  Right-sided low back pain with right-sided sciatica, unspecified chronicity     Discharge Instructions      Take medication as prescribed. May also take over-the-counter Zyrtec to help with itching. Avoid hot baths or showers while symptoms persist. Recommend taking lukewarm baths. May apply cool cloths to the area to help with itching or discomfort. Avoid scratching,  rubbing, or manipulating the areas while symptoms persist. Recommend Aveeno colloidal oatmeal bath to use to help with drying and itching.     Take medication as prescribed. Try to remain as active as possible. Gentle range of motion and stretching exercises to help with back spasm and pain.  I have provided information on stretching exercises that you can do at home. May apply ice or heat as needed.  Ice is recommended for pain or swelling, heat for spasm or stiffness.  Apply for 20 minutes, remove for 1 hour, then repeat. While you are taking prednisone, may take over-the-counter Tylenol extra strength or Tylenol arthritis strength tablets to help with pain or discomfort.  Once you finish the prednisone, you can begin taking ibuprofen as needed. Go to the emergency department immediately if you develop weakness in your legs or feet, inability to walk, loss of bowel or bladder function, difficulty urinating or passing a bowel movement, or other concerns.  If symptoms fail to improve, please follow-up with your primary care physician.     ED Prescriptions     Medication Sig Dispense Auth. Provider   predniSONE (DELTASONE) 20 MG tablet Take 2 tablets (40 mg total) by mouth daily with breakfast for 5 days. 10 tablet Danniel Grenz-Warren, Alda Lea, NP   famotidine (PEPCID) 20 MG tablet Take 1 tablet (20 mg total) by mouth daily. 30 tablet Elna Radovich-Warren, Alda Lea, NP   cetirizine (ZYRTEC) 10 MG tablet Take 1 tablet (10 mg total) by mouth daily. 30 tablet Mikaila Grunert-Warren, Alda Lea, NP   methocarbamol (ROBAXIN) 500 MG tablet Take 1 tablet (500 mg total) by mouth 2 (two) times daily. 20 tablet Kellis Mcadam-Warren, Alda Lea, NP      PDMP not reviewed this encounter.   Tish Men, NP 12/07/22 [phone]

## 2022-12-07 NOTE — Discharge Instructions (Addendum)
Take medication as prescribed. May also take over-the-counter Zyrtec to help with itching. Avoid hot baths or showers while symptoms persist. Recommend taking lukewarm baths. May apply cool cloths to the area to help with itching or discomfort. Avoid scratching, rubbing, or manipulating the areas while symptoms persist. Recommend Aveeno colloidal oatmeal bath to use to help with drying and itching.     Take medication as prescribed. Try to remain as active as possible. Gentle range of motion and stretching exercises to help with back spasm and pain.  I have provided information on stretching exercises that you can do at home. May apply ice or heat as needed.  Ice is recommended for pain or swelling, heat for spasm or stiffness.  Apply for 20 minutes, remove for 1 hour, then repeat. While you are taking prednisone, may take over-the-counter Tylenol extra strength or Tylenol arthritis strength tablets to help with pain or discomfort.  Once you finish the prednisone, you can begin taking ibuprofen as needed. Go to the emergency department immediately if you develop weakness in your legs or feet, inability to walk, loss of bowel or bladder function, difficulty urinating or passing a bowel movement, or other concerns.  If symptoms fail to improve, please follow-up with your primary care physician.

## 2022-12-07 NOTE — ED Triage Notes (Addendum)
Pt reports rash that started this weekend. Pt reports "its only on my upper body". Pt denies any new self care products and reports has known latex allergy but is not sure if balloons at a party party could have contributed.   Airway patent. NAD noted. Benadryl has not helped with symptoms.  Pt also reports right lower back pain since christmas. Pt denies and known injury but reports "may have pulled something". Denies pain at this time but reports "radiates into my leg at times."

## 2023-03-09 IMAGING — US US OB < 14 WEEKS - US OB TV
1 series · 15 of 28 positions shown · non-contrast
Comparison: None.

CLINICAL DATA: Pregnancy

EXAM:
OBSTETRIC <14 WK US AND TRANSVAGINAL OB US
TECHNIQUE: Both transabdominal and transvaginal ultrasound examinations were
performed for complete evaluation of the gestation as well as the
maternal uterus, adnexal regions, and pelvic cul-de-sac.
Transvaginal technique was performed to assess early pregnancy.

[Series 1: us ob < 14 weeks - us ob tv · 15 of 39 slices shown]
[im 1/39]
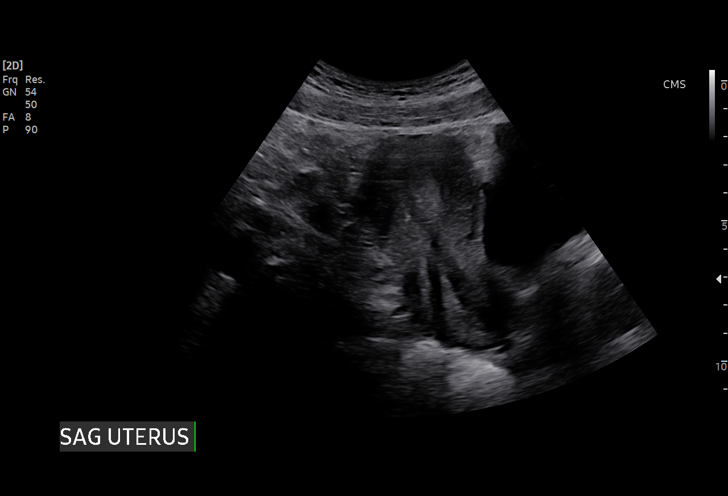
[im 3/39]
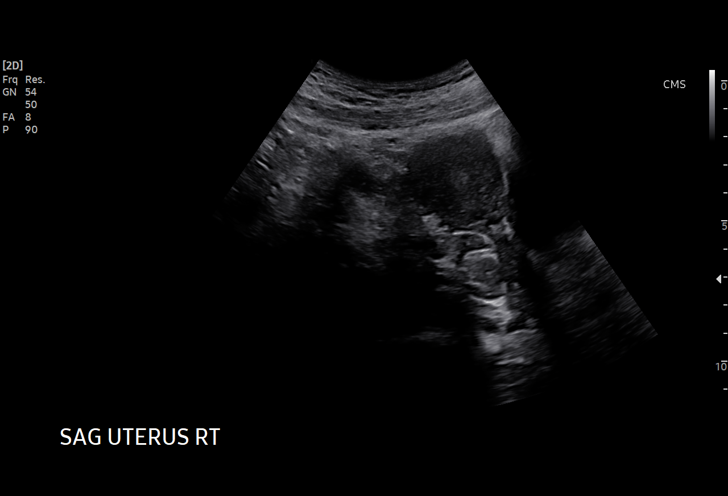
[im 6/39]
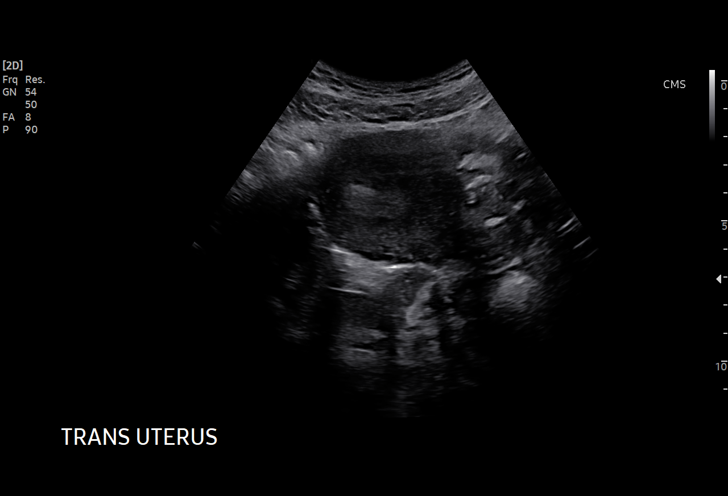
[im 9/39]
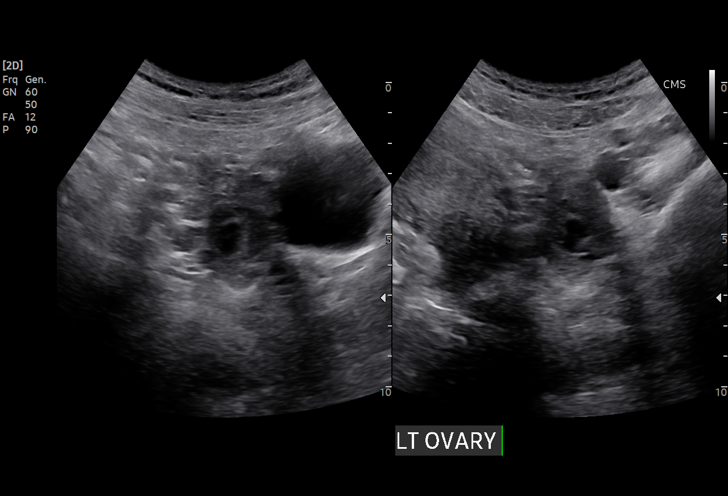
[im 12/39]
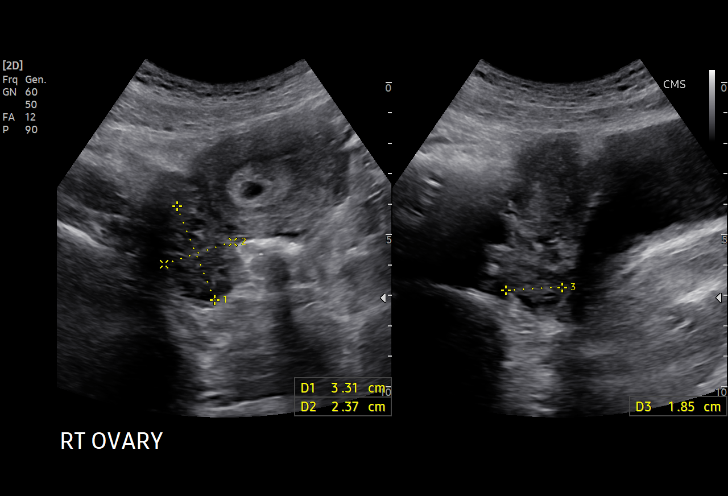
[im 15/39]
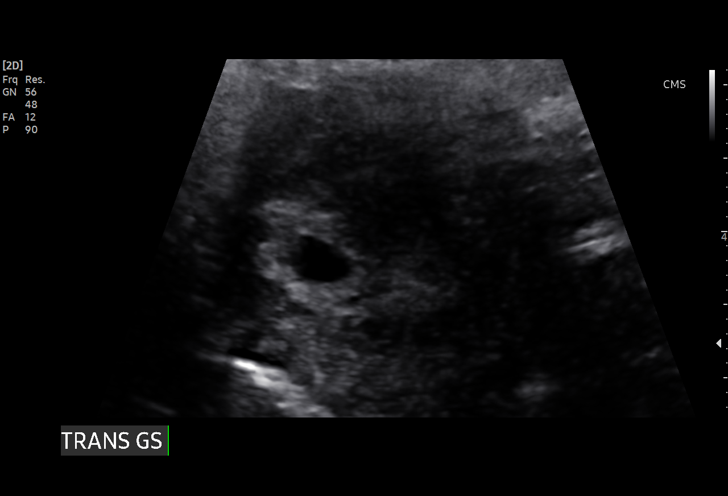
[im 17/39]
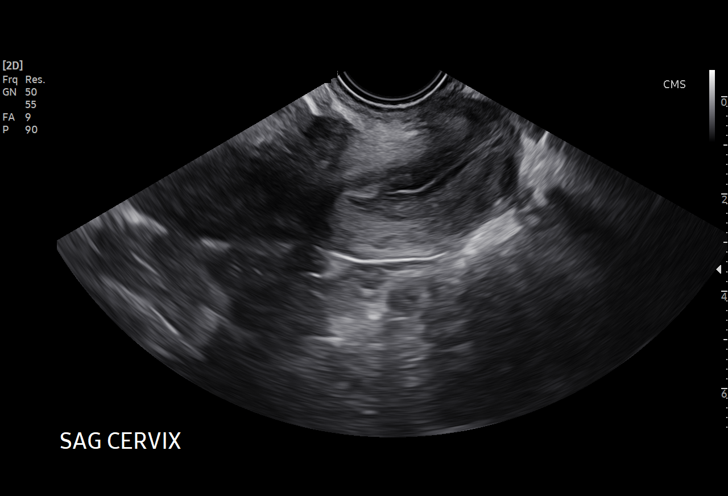
[im 20/39]
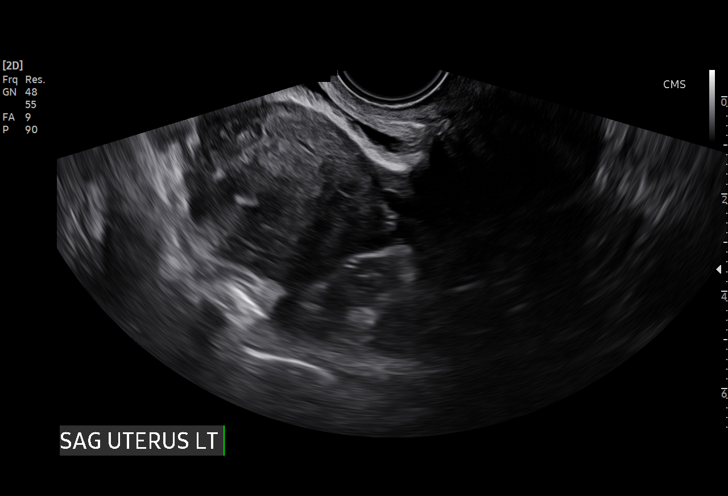
[im 22/39]
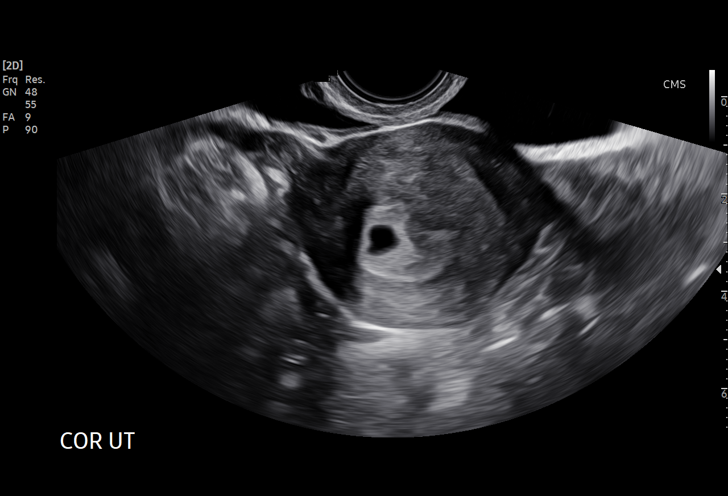
[im 24/39]
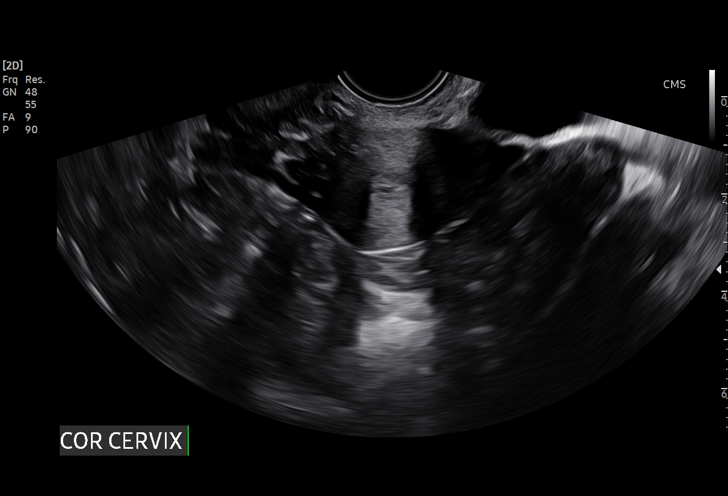
[im 27/39]
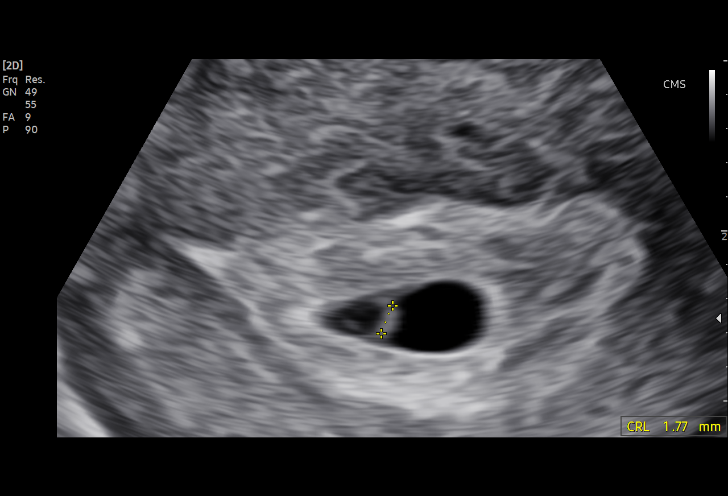
[im 30/39]
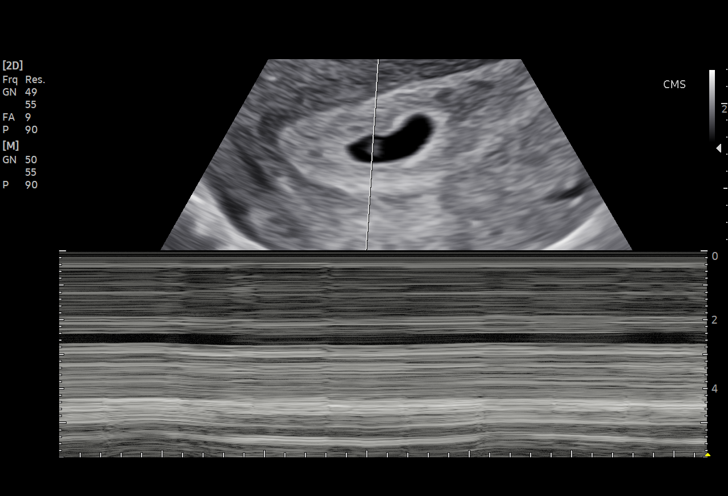
[im 33/39]
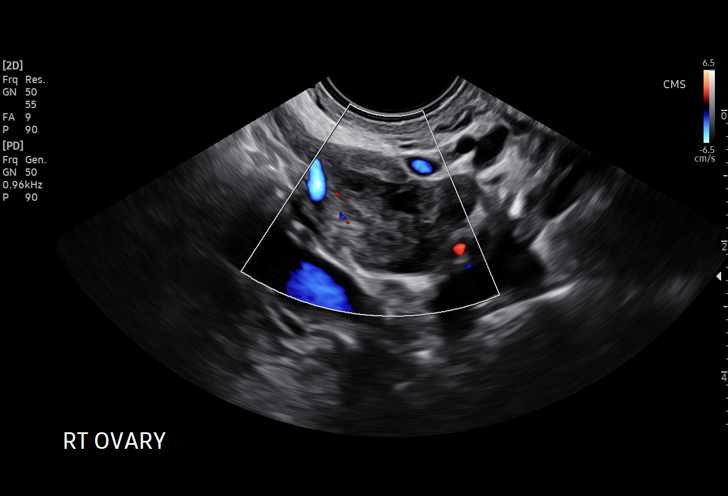
[im 36/39]
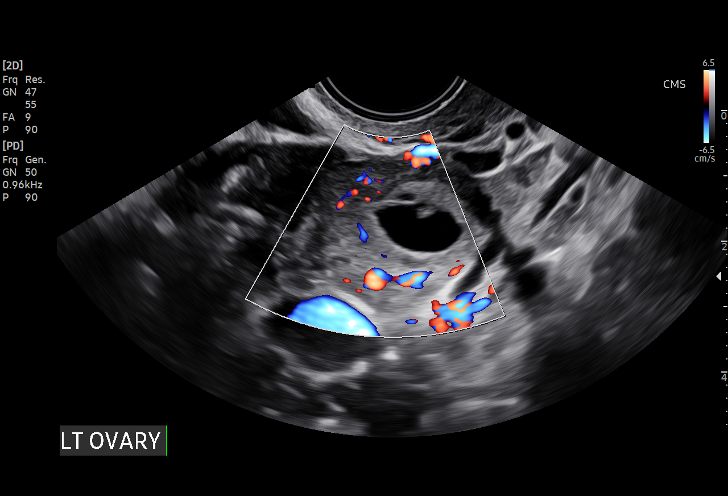
[im 39/39]
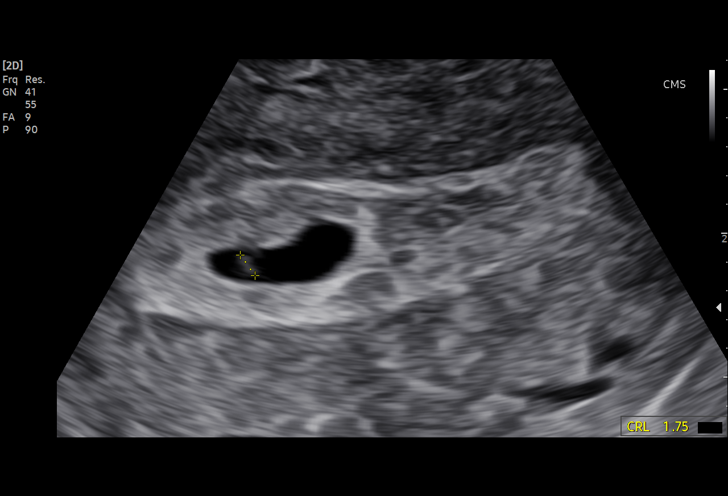

[15 of 28 positions shown; findings below may reference images not displayed]

FINDINGS: Intrauterine gestational sac: Single

Yolk sac:  Visualized.

Embryo:  Visualized.

Cardiac Activity: Not Visualized.

CRL: 19.2 mm. Embryo too small for system to assign gestational age

Subchorionic hemorrhage:  None visualized.

Maternal uterus/adnexae: Uterus and bilateral ovaries are within
normal limits. Probable left ovarian corpus luteal cyst. No free
fluid within the pelvis.
IMPRESSION: Intrauterine pregnancy with embryo measuring 19.2 mm by crown-rump
length. Embryo is too small to measure cardiac activity or for
system to assign a gestational age at this time. Recommend follow-up
quantitative B-HCG levels and follow-up US in 14 days to assess
viability.

## 2023-03-23 IMAGING — US US OB TRANSVAGINAL
1 series · 15 of 28 positions shown · non-contrast
Comparison: June 03, 2021.

CLINICAL DATA: Pregnancy of unknown location.

EXAM:
TRANSVAGINAL OB ULTRASOUND
TECHNIQUE: Transvaginal ultrasound was performed for complete evaluation of the
gestation as well as the maternal uterus, adnexal regions, and
pelvic cul-de-sac.

[Series 1: us ob transvaginal · 84 acquisitions, 15 frames shown]
[im 1/84]
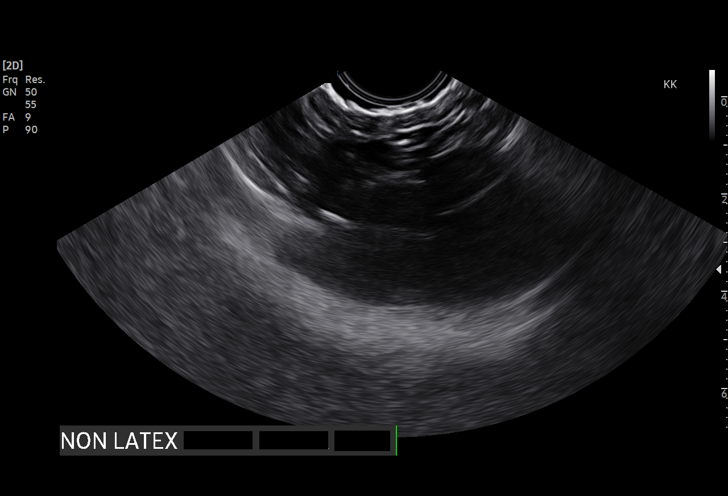
[im 7/84]
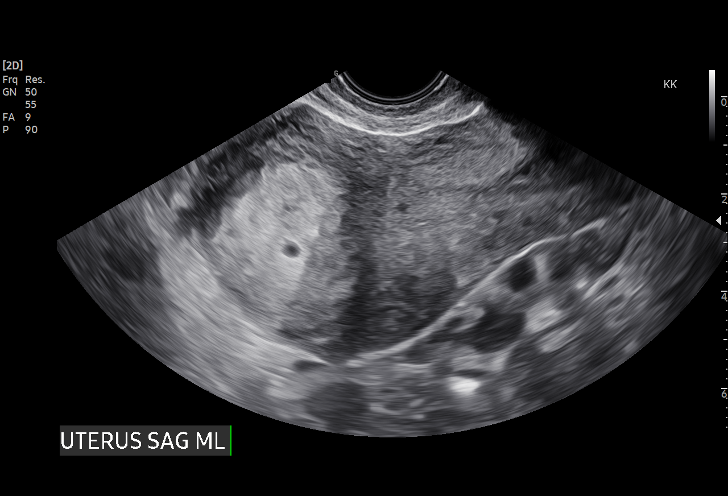
[im 13/84]
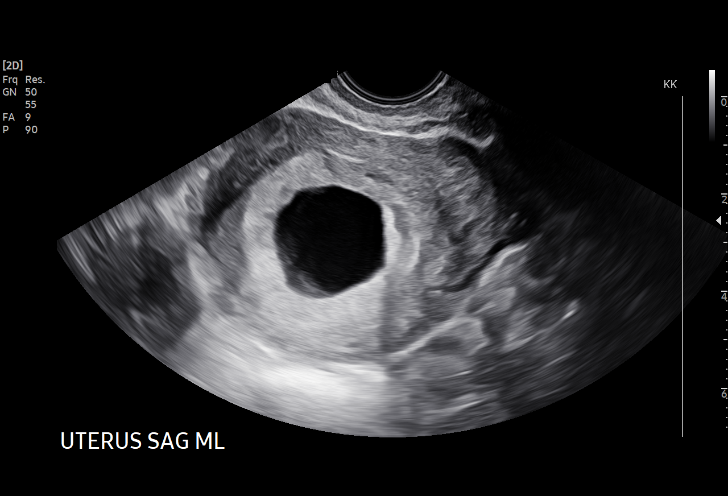
[im 19/84]
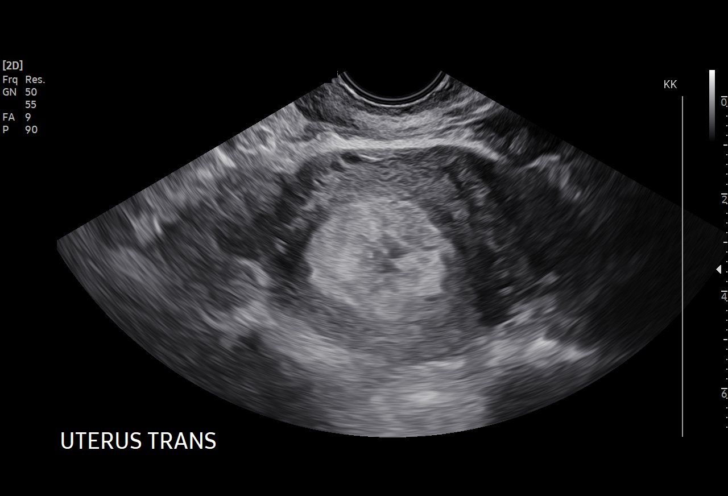
[im 25/84]
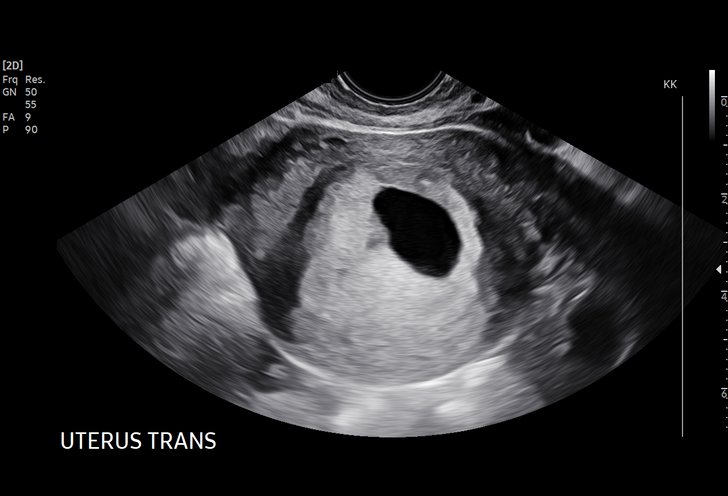
[im 31/84]
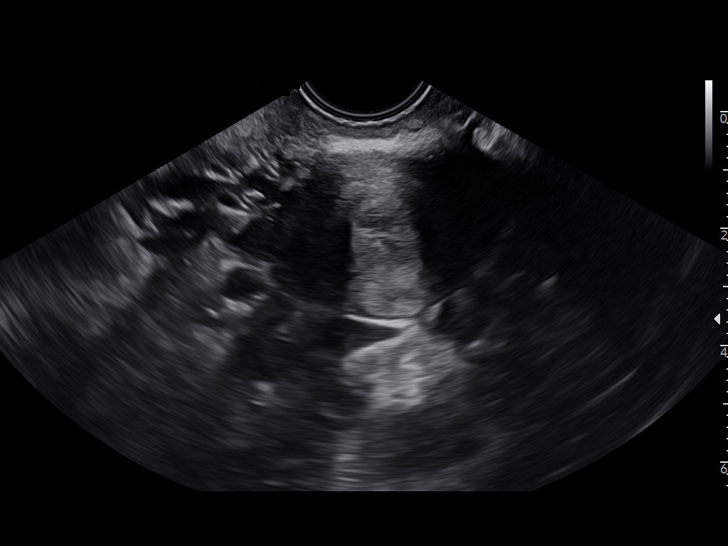
[im 37/84]
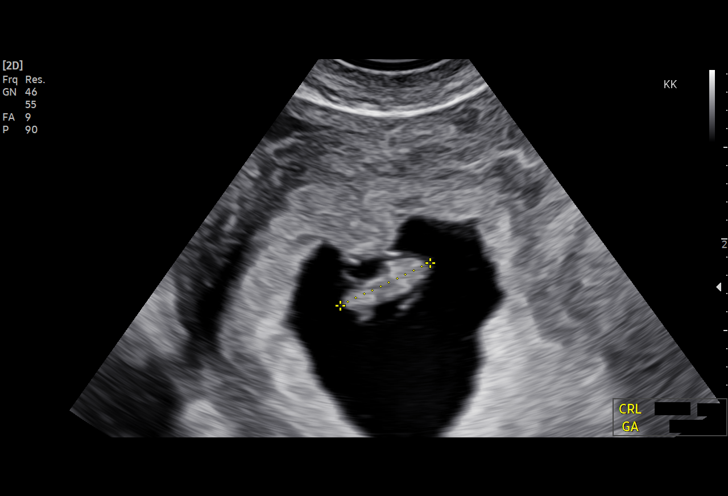
[im 44/84]
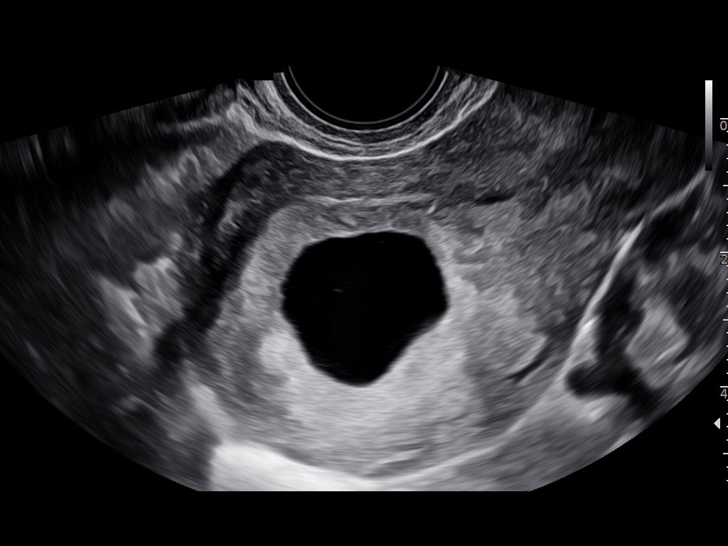
[im 47/84]
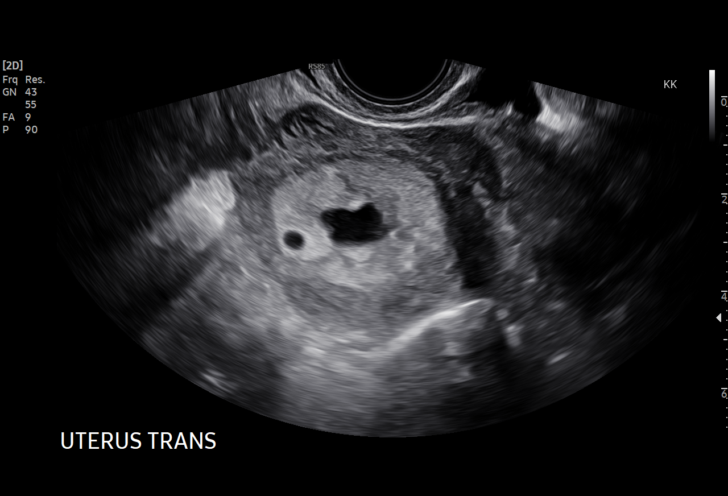
[im 53/84]
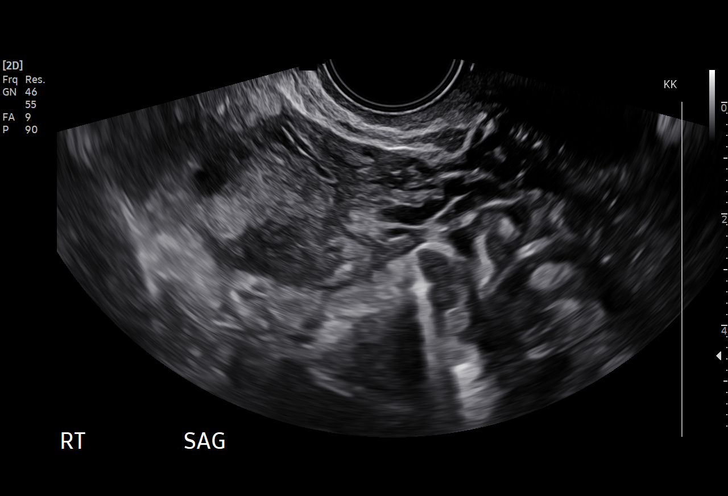
[im 59/84]
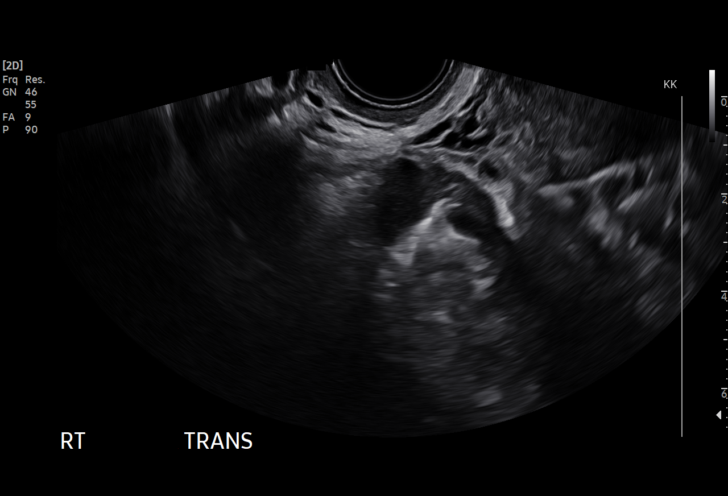
[im 65/84]
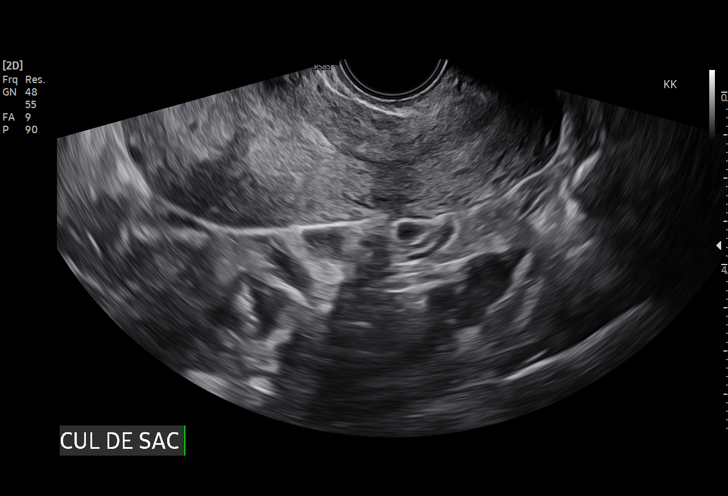
[im 71/84]
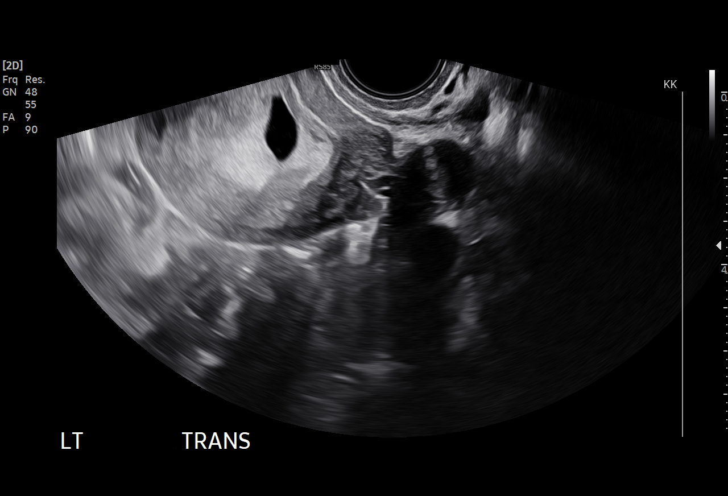
[im 77/84]
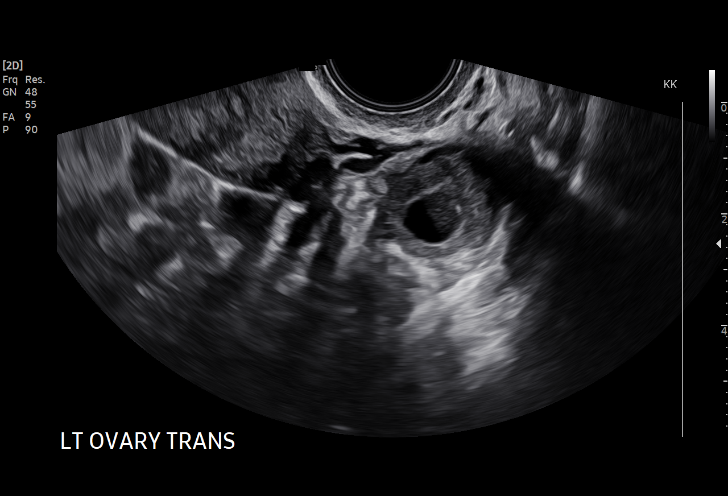
[im 84/84]
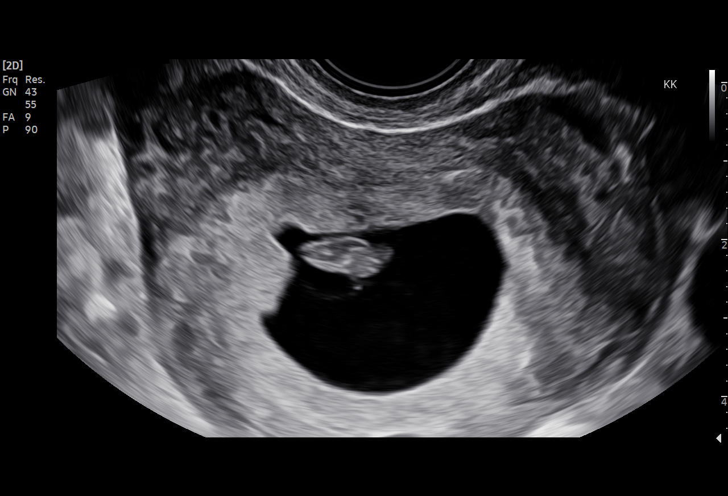

[15 of 28 positions shown; findings below may reference images not displayed]

FINDINGS: Intrauterine gestational sac: Single

Yolk sac:  Visualized.

Embryo:  Visualized.

Cardiac Activity: Visualized.

Heart Rate: 150 bpm

CRL:   10.7 mm   7 w 1 d                  US EDC: February 02, 2022.

Subchorionic hemorrhage:  None visualized.

Maternal uterus/adnexae: No free fluid is noted. Corpus luteum cyst
seen in left ovary. Right ovary is unremarkable.
IMPRESSION: Single live intrauterine gestation of 7 weeks 1 day.

## 2024-02-07 DIAGNOSIS — J019 Acute sinusitis, unspecified: Secondary | ICD-10-CM | POA: Diagnosis not present

## 2024-02-07 DIAGNOSIS — Z6821 Body mass index (BMI) 21.0-21.9, adult: Secondary | ICD-10-CM | POA: Diagnosis not present

## 2024-04-07 DIAGNOSIS — J019 Acute sinusitis, unspecified: Secondary | ICD-10-CM | POA: Diagnosis not present

## 2024-04-07 DIAGNOSIS — R070 Pain in throat: Secondary | ICD-10-CM | POA: Diagnosis not present

## 2024-04-07 DIAGNOSIS — R509 Fever, unspecified: Secondary | ICD-10-CM | POA: Diagnosis not present

## 2024-11-14 ENCOUNTER — Ambulatory Visit
Admission: RE | Admit: 2024-11-14 | Discharge: 2024-11-14 | Disposition: A | Discharge status: Home / Self Care | Payer: Self-pay | Source: Home / Self Care

## 2024-11-14 VITALS — BP 123/73 | HR 63 | Temp 98.5°F | Resp 18

## 2024-11-14 DIAGNOSIS — S8392XA Sprain of unspecified site of left knee, initial encounter: Secondary | ICD-10-CM

## 2024-11-14 NOTE — ED Triage Notes (Signed)
 Pt reports left knee pain, bending the knee and walking the knee causes pain. Swelling was present in the left knee last night pt has been using RICE method at home as well as KT tape to apply compression to the knee. Denies injury to the knee.

## 2024-11-14 NOTE — Discharge Instructions (Addendum)
 Based on your symptoms, you most likely have sprained your left knee.  A knee brace has been provided to allow for additional compression and support.  Wear the brace when you are engaged in prolonged or strenuous activity.  I do encourage you to remove the brace throughout the day and perform gentle range of motion exercises while symptoms persist. RICE therapy, rest, ice, compression, and elevation.  Apply ice for 20 minutes, remove for 1 hour, repeat as needed while symptoms persist. You may take over-the-counter Tylenol  or ibuprofen  as needed for pain or discomfort. As discussed, symptoms should begin to improve over the next 1 to 2 weeks.  If symptoms fail to improve, or begin to worsen, you may follow-up in this clinic or with orthopedics for further evaluation. Follow-up as needed.

## 2024-11-14 NOTE — ED Provider Notes (Signed)
 [facility]    CSN: [id number] Arrival date & time: 11/14/24  1251      History   Chief Complaint Chief Complaint  Patient presents with   Knee Pain    Entered by patient    HPI Samantha Knapp is a 33 y.o. female.   The history is provided by the patient.   Patient presents for a 1 day history of left knee pain.  Patient states that she was helping her family unload wood.  She states that she was on back of the truck while doing so.  She states that while she was unloading the wood, she did not experience any injury or trauma to her left knee.  She states later that evening, she had pain with bending the knee and pain with walking.  She states that she did have swelling to the left knee last evening, but that has since improved.  States that she has not had any numbness, tingling, weakness, instability, bruising, or redness.  Patient states that she has applied Kinesiotape and use a hot shower since her symptoms started.  Past Medical History:  Diagnosis Date   DDD (degenerative disc disease), lumbar    Frequent UTI    GERD (gastroesophageal reflux disease)    Migraines    Has improved.    Patient Active Problem List   Diagnosis Date Noted   Shoulder dystocia during labor and delivery 02/07/2022   Gestational hypertension 02/07/2022   Post term pregnancy over 40 weeks 02/05/2022   Cervical high risk HPV (human papillomavirus) test positive 08/12/2021   Chronic low back pain 08/14/2012    Past Surgical History:  Procedure Laterality Date   NO PAST SURGERIES     TYMPANOSTOMY TUBE PLACEMENT      OB History     Gravida  1   Para  1   Term  1   Preterm      AB      Living  1      SAB      IAB      Ectopic      Multiple  0   Live Births  1            Home Medications    Prior to Admission medications  Medication Sig Start Date End Date Taking? Authorizing Provider  acetaminophen  (TYLENOL ) 325 MG tablet Take 2 tablets  (650 mg total) by mouth every 4 (four) hours. Patient not taking: Reported on 02/15/2022 02/08/22   Jarrett Lucie SAILOR, DO  Blood Pressure Monitor MISC For regular home bp monitoring during pregnancy Patient not taking: Reported on 02/15/2022 07/30/21   Kizzie Suzen SAUNDERS, CNM  cetirizine  (ZYRTEC ) 10 MG tablet Take 1 tablet (10 mg total) by mouth daily. 12/07/22   Leath-Warren, Etta PARAS, NP  famotidine  (PEPCID ) 20 MG tablet Take 1 tablet (20 mg total) by mouth daily. 12/07/22 01/06/23  Leath-Warren, Etta PARAS, NP  fluticasone  (FLONASE ) 50 MCG/ACT nasal spray Place 2 sprays into both nostrils daily. Patient not taking: Reported on 02/15/2022 01/30/22   Christopher Savannah, PA-C  furosemide  (LASIX ) 20 MG tablet Take 1 tablet (20 mg total) by mouth daily for 4 days. 02/08/22 02/12/22  Jarrett Lucie SAILOR, DO  ibuprofen  (ADVIL ) 400 MG tablet Take 1 tablet (400 mg total) by mouth every 6 (six) hours as needed for cramping. Patient not taking: Reported on 03/24/2022 02/08/22   Jarrett Lucie SAILOR, DO  methocarbamol  (ROBAXIN ) 500 MG tablet Take 1 tablet (500  mg total) by mouth 2 (two) times daily. 12/07/22   Leath-Warren, Etta PARAS, NP  omeprazole  (PRILOSEC) 20 MG capsule Take 1 capsule (20 mg total) by mouth daily. 1 tablet a day Patient not taking: Reported on 02/15/2022 12/27/21   Jayne Vonn DEL, MD  levonorgestrel  (MIRENA , 52 MG,) 20 MCG/24HR IUD 1 Intra Uterine Device (1 each total) by Intrauterine route once. 08/31/16 06/01/21  Johnny Garnette LABOR, MD    Family History Family History  Problem Relation Age of Onset   Diabetes Mother    Hyperlipidemia Mother    Scoliosis Brother    Headache Brother    Diabetes Maternal Grandmother    Lung cancer Maternal Grandmother    Diabetes Maternal Grandfather    Esophageal cancer Maternal Grandfather    Cancer Paternal Grandmother    Breast cancer Paternal Grandmother    Lymphoma Paternal Grandfather     Social History Social History[1]   Allergies   Patient has no known  allergies.   Review of Systems Review of Systems Per HPI  Physical Exam Triage Vital Signs ED Triage Vitals [11/14/24 1315]  Encounter Vitals Group     BP 123/73     Girls Systolic BP Percentile      Girls Diastolic BP Percentile      Boys Systolic BP Percentile      Boys Diastolic BP Percentile      Pulse Rate 63     Resp 18     Temp 98.5 F (36.9 C)     Temp Source Oral     SpO2 97 %     Weight      Height      Head Circumference      Peak Flow      Pain Score 6     Pain Loc      Pain Education      Exclude from Growth Chart    No data found.  Updated Vital Signs BP 123/73 (BP Location: Right Arm)   Pulse 63   Temp 98.5 F (36.9 C) (Oral)   Resp 18   LMP  (LMP Unknown) Comment: pt states she does not have a period with the IUD  SpO2 97%   Visual Acuity Right Eye Distance:   Left Eye Distance:   Bilateral Distance:    Right Eye Near:   Left Eye Near:    Bilateral Near:     Physical Exam Vitals and nursing note reviewed.  Constitutional:      General: She is not in acute distress.    Appearance: Normal appearance.  HENT:     Head: Normocephalic.  Eyes:     Extraocular Movements: Extraocular movements intact.     Pupils: Pupils are equal, round, and reactive to light.  Pulmonary:     Effort: Pulmonary effort is normal.  Musculoskeletal:     Left knee: No swelling.  Skin:    General: Skin is warm and dry.  Neurological:     General: No focal deficit present.     Mental Status: She is alert and oriented to person, place, and time.  Psychiatric:        Mood and Affect: Mood normal.        Behavior: Behavior normal.      UC Treatments / Results  Labs (all labs ordered are listed, but only abnormal results are displayed) Labs Reviewed - No data to display  EKG   Radiology No results found.  Procedures Procedures (including critical  care time)  Medications Ordered in UC Medications - No data to display  Initial Impression /  Assessment and Plan / UC Course  I have reviewed the triage vital signs and the nursing notes.  Pertinent labs & imaging results that were available during my care of the patient were reviewed by me and considered in my medical decision making (see chart for details).  Patient with pain and swelling in the left knee that started over the past 24 hours.  On exam, no obvious swelling, bruising, or deformity present.  Patient did not experience a traumatic event.  Will defer imaging at this time.  Symptoms consistent with a possible sprain/strain of the left knee.  Will treat with a knee brace to allow for additional compression and support.  Supportive care recommendations were provided and discussed with the patient to include RICE therapy and over-the-counter analgesics.  Discussed indications with the patient regarding follow-up.  Patient was in agreement with this plan of care and verbalizes understanding.  All questions were answered.  Patient stable for discharge.  Work note was provided.   Final Clinical Impressions(s) / UC Diagnoses   Final diagnoses:  None   Discharge Instructions   None    ED Prescriptions   None    PDMP not reviewed this encounter.    [1]  Social History Tobacco Use   Smoking status: Never   Smokeless tobacco: Never  Vaping Use   Vaping status: Never Used  Substance Use Topics   Alcohol use: Not Currently    Comment: occ   Drug use: Not Currently     Gilmer Etta PARAS, NP 11/14/24 1401
